# Patient Record
Sex: Male | Born: 1945 | Race: White | Hispanic: No | Marital: Married | Smoking: Former smoker
Health system: Southern US, Community
[De-identification: ages and names within clinical notes are randomized; demographics above are authoritative.]

## PROBLEM LIST (undated history)

## (undated) DIAGNOSIS — K219 Gastro-esophageal reflux disease without esophagitis: Secondary | ICD-10-CM

## (undated) DIAGNOSIS — IMO0001 Reserved for inherently not codable concepts without codable children: Secondary | ICD-10-CM

## (undated) DIAGNOSIS — I10 Essential (primary) hypertension: Secondary | ICD-10-CM

## (undated) DIAGNOSIS — E785 Hyperlipidemia, unspecified: Secondary | ICD-10-CM

## (undated) DIAGNOSIS — J302 Other seasonal allergic rhinitis: Secondary | ICD-10-CM

## (undated) DIAGNOSIS — E669 Obesity, unspecified: Secondary | ICD-10-CM

## (undated) DIAGNOSIS — K449 Diaphragmatic hernia without obstruction or gangrene: Secondary | ICD-10-CM

## (undated) DIAGNOSIS — R42 Dizziness and giddiness: Secondary | ICD-10-CM

## (undated) DIAGNOSIS — Z87898 Personal history of other specified conditions: Secondary | ICD-10-CM

## (undated) DIAGNOSIS — Z8719 Personal history of other diseases of the digestive system: Secondary | ICD-10-CM

## (undated) DIAGNOSIS — Z91018 Allergy to other foods: Secondary | ICD-10-CM

## (undated) HISTORY — PX: COLONOSCOPY W/ BIOPSIES AND POLYPECTOMY: SHX1376

## (undated) HISTORY — PX: EYE SURGERY: SHX253

---

## 2002-11-10 ENCOUNTER — Inpatient Hospital Stay (HOSPITAL_COMMUNITY): Admission: AD | Admit: 2002-11-10 | Discharge: 2002-11-11 | Payer: Self-pay | Admitting: Internal Medicine

## 2002-11-17 ENCOUNTER — Ambulatory Visit (HOSPITAL_COMMUNITY): Admission: RE | Admit: 2002-11-17 | Discharge: 2002-11-17 | Payer: Self-pay | Admitting: Internal Medicine

## 2002-11-17 ENCOUNTER — Encounter (INDEPENDENT_AMBULATORY_CARE_PROVIDER_SITE_OTHER): Payer: Self-pay | Admitting: Internal Medicine

## 2002-12-17 ENCOUNTER — Ambulatory Visit (HOSPITAL_COMMUNITY): Admission: RE | Admit: 2002-12-17 | Discharge: 2002-12-17 | Payer: Self-pay | Admitting: Internal Medicine

## 2005-02-01 ENCOUNTER — Ambulatory Visit: Payer: Self-pay | Admitting: Family Medicine

## 2005-02-22 ENCOUNTER — Ambulatory Visit: Payer: Self-pay | Admitting: Family Medicine

## 2005-09-17 ENCOUNTER — Ambulatory Visit: Payer: Self-pay | Admitting: Family Medicine

## 2005-10-21 ENCOUNTER — Ambulatory Visit: Payer: Self-pay | Admitting: Family Medicine

## 2006-06-30 ENCOUNTER — Ambulatory Visit: Payer: Self-pay | Admitting: Family Medicine

## 2006-10-29 ENCOUNTER — Ambulatory Visit: Payer: Self-pay | Admitting: Family Medicine

## 2008-09-06 ENCOUNTER — Encounter: Admission: RE | Admit: 2008-09-06 | Discharge: 2008-09-06 | Payer: Self-pay | Admitting: Internal Medicine

## 2008-09-13 ENCOUNTER — Encounter: Admission: RE | Admit: 2008-09-13 | Discharge: 2008-09-13 | Payer: Self-pay | Admitting: Internal Medicine

## 2009-12-29 ENCOUNTER — Ambulatory Visit (HOSPITAL_COMMUNITY): Admission: RE | Admit: 2009-12-29 | Discharge: 2009-12-29 | Payer: Self-pay | Admitting: Ophthalmology

## 2010-12-31 LAB — BASIC METABOLIC PANEL
BUN: 13 mg/dL (ref 6–23)
CO2: 26 mEq/L (ref 19–32)
Calcium: 9.2 mg/dL (ref 8.4–10.5)
Chloride: 101 mEq/L (ref 96–112)
Creatinine, Ser: 1.25 mg/dL (ref 0.4–1.5)
GFR calc Af Amer: >60 mL/min (ref >60)
GFR calc non Af Amer: 58 mL/min — ABNORMAL LOW (ref >60)
Glucose, Bld: 102 mg/dL — ABNORMAL HIGH (ref 70–99)
Potassium: 4.1 mEq/L (ref 3.5–5.1)
Sodium: 134 mEq/L — ABNORMAL LOW (ref 135–145)

## 2010-12-31 LAB — CBC
HCT: 41.5 % (ref 39.0–52.0)
Hemoglobin: 14.4 g/dL (ref 13.0–17.0)
MCHC: 34.8 g/dL (ref 30.0–36.0)
MCV: 96.1 fL (ref 78.0–100.0)
Platelets: 181 10*3/uL (ref 150–400)
RBC: 4.32 MIL/uL (ref 4.22–5.81)
RDW: 13 % (ref 11.5–15.5)
WBC: 7.7 10*3/uL (ref 4.0–10.5)

## 2011-02-22 NOTE — Consult Note (Signed)
NAME:  Christopher Mata, Christopher Mata NO.:  [id number]   MEDICAL RECORD NO.:  [id number]                   PATIENT TYPE:   LOCATION:                                       FACILITY:   PHYSICIAN:  Lionel December, M.D.                 DATE OF BIRTH:  08/24/1946   DATE OF CONSULTATION:  DATE OF DISCHARGE:                                   CONSULTATION   PRESENTING COMPLAINT:  Follow-up for anemia and GI bleed.   HISTORY OF PRESENT ILLNESS:  The patient is a 65 year old Caucasian male who  was hospitalized on November 10, 2002 and discharged a day later.  He was  admitted with profound microcytic anemia and history of melena.  Admission  hemoglobin was 7.2 grams.  He was given 2 units of PRBCs and hemoglobin came  up to about 8.7.  He was given a third unit and by the time he was  discharged his hemoglobin was up to 9.7.  He had esophagogastroduodenoscopy  which revealed ulcerative reflux esophagitis, antral gastritis, and a small  gastric antral ulcer.  He also had a large complex hiatal hernia,  most of  which appeared to be sliding.  He was discharged on iron and maintained on  Prilosec.  He returned for upper GI series on November 17, 2002 which  revealed a large complex hernia and there was suggestion of organoaxial  rotation.  He also had reflux gastritis and Dr. Frazier Richards - who did the study  - thought that there was a large gastric ulcer in the body of the stomach.  This was not the case on recent EGD.   The patient states he is feeling a lot better.  His stools are black because  he is taking ferrous sulfate.  He denies heartburn, dysphagia, chest or  abdominal pain.   MEDICATIONS:  1. Prilosec 20 mg b.i.d.  2. Ferrous sulfate 325 mg b.i.d.   PAST MEDICAL HISTORY:  1. GERD of six or seven years duration.  2. History of colonic polyps.  Last colonoscopy was about six or seven years     ago.  3. He had surgery for deflected nasal septum in 1996.   ALLERGIES:  None known.   FAMILY HISTORY:  Positive for colon carcinoma in a mother who died of  metastatic disease in her 72s.   SOCIAL HISTORY:  He has not smoked in 16 years and he drinks alcohol  socially.  He is presently unemployed but has a job interview.  He was  recently laid off from another company.   OBJECTIVE:  VITAL SIGNS:  Weight 208 pounds; he is 5 feet 11 inches tall.  Pulse 86 per minute, blood pressure 130/100.  HEENT:  Conjunctivae are pink, sclerae nonicteric.  NECK:  Without masses, thyromegaly.  HEART AND LUNG:  Exam is within normal limits.  ABDOMEN:  Soft and nontender without organomegaly or  masses.  EXTREMITIES:  He does not have clubbing or coilonychia.   LABORATORY DATA:  CBC from November 25, 2002:  H&H 11.8 and 38.2, MCV 72.1,  wbc 6.3.   ASSESSMENT:  1. 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  He is at risk for incarceration or     frequent bleed.  I really feel that he should have surgery and get it     fixed.  I feel that this could be done laparoscopically.  2. History of colonic polyps and family history of colon carcinoma.  Last     colonoscopy was over five years ago.   PLAN:  Will hold off his iron temporarily and prep him for a colonoscopy two  weeks from now.   Once GI tract evaluation is completed will make an appointment for him to be  seen at tertiary center.  At the present, the patient is not sure he wants  to undergo any surgery but I would encourage him to at least get an opinion.  In the meantime he will continue Prilosec at 20 mg b.i.d.  I have reviewed  the procedure with the patient and he is agreeable.                                               Lionel December, M.D.    NR/MEDQ  D:  11/29/2002  T:  11/29/2002  Job:  [id number]   cc:   Colon Flattery  [address]   [place]  [place] [address]  Fax: [phone]

## 2011-02-22 NOTE — Discharge Summary (Signed)
NAME:  Christopher Mata, Christopher Mata NO.:  [id number]   MEDICAL RECORD NO.:  [id number]                   PATIENT TYPE:  INP   LOCATION:  A301                                 FACILITY:  [facility]   PHYSICIAN:  Lionel December, M.D.                 DATE OF BIRTH:  02/28/1946   DATE OF ADMISSION:  11/10/2002  DATE OF DISCHARGE:  11/11/2002                                 DISCHARGE SUMMARY   DISCHARGE DIAGNOSES:  1. Profound anemia secondary to gastrointestinal bleed.  2. Complex hiatal hernia.  3. Esophageal ulcer.  4. Erosive gastritis with small gastric ulcer.   CONDITION AT THE TIME OF DISCHARGE:  Much improved.   PRINCIPAL PROCEDURE:  EGD November 11, 2002, by Dr. Karilyn Cota.   DISCHARGE MEDICATIONS:  1. Prilosec 20 mg b.i.d. (OTC).  2. Ferrous sulfate 325 mg b.i.d. with meals.   HOSPITAL COURSE:  The patient is a 65 year old Caucasian male who was  hospitalized directly having been seen at Dr. Karmen Stabs office for profound  anemia and heme-positive stools.  The patient had been getting progressively  weaker, which led to the diagnosis of anemia.  He did have a history of  melena.  When he was seen in Dr. Karmen Stabs office stool was guaiac-positive.  He has a history of GERD and had been maintained on Prilosec.  He also had  been on ASA and using Advil fairly frequently for headache.   His laboratories on admission:  His H&H was 7.1 and 23.9, MCV was 60.3, WBC  was 5.8, platelet count was 374,000.  His PT and PTT were normal.  His  electrolytes, BUN, creatinine, hepatic profile were all normal.  He was  begun on IV fluids and given Protonix 40 mg IV q.24h.  He was given two  units of packed red blood cells.  This brought his hemoglobin up to 8.5 and  hematocrit of 27.5.  He underwent esophagogastroduodenoscopy on November 11, 2002, which revealed esophageal ulcer with a large complex hernia, erosive  gastritis with a small gastric ulcer.  He was given another unit of  blood,  and his H&H was up to 9.7 and 25.4.  The patient was discharged in much  improved condition.  The plan is for him to return for upper GI series to  further look at this complex hernia for which I believe he will eventually  require surgery.  Once his hemoglobin is above 10 g, will hold his iron and  bring him back for total colonoscopy.                                               Lionel December, M.D.    NR/MEDQ  D:  11/29/2002  T:  11/29/2002  Job:  [id number]  cc:   Trellis Moment  [address]  [place]  [place] [address]  Fax: [phone]

## 2011-02-22 NOTE — Op Note (Signed)
NAME:  Christopher Mata, ROUTE NO.:  [id number]   MEDICAL RECORD NO.:  [id number]                   PATIENT TYPE:  INP   LOCATION:  A301                                 FACILITY:  [facility]   PHYSICIAN:  Lionel December, M.D.                 DATE OF BIRTH:  07-14-1946   DATE OF PROCEDURE:  11/11/2002  DATE OF DISCHARGE:                                 OPERATIVE REPORT   PROCEDURE:  Esophagogastroduodenoscopy.   INDICATIONS FOR PROCEDURE:  Casmir is a 65 year old Caucasian male with  iron deficiency anemia and heme positive stools. His H. pylori serology is  positive. He has been taking ASA in addition to over the counter Ancef. He  also gives a history of recent melena. His family history is positive for  colon carcinoma and his last colonoscopy was 5+ years ago. He is undergoing  diagnostic EGD. The patient has received 2 units of packed red blood cells  and hemoglobin dropped from 7.1 to 8.5 gm and he feels a lot better. The  procedure is reviewed with the patient and informed consent was obtained.   PREOP MEDICATIONS:  Cetacaine spray for pharyngeal topical anesthesia.  Demerol 25 mg IV, Versed 4 mg IV in divided dose.   INSTRUMENT:  Olympus video system.   FINDINGS:  Procedure performed in endoscopy suite. The patient's vital signs  and O2 saturations were monitored during the procedure. During the  procedure, he did have a transient Wenckebach while I was trying to pass the  scope from a large complex hernia into interim. This resolved without any  intervention. The patient was placed in the left lateral decubitus position,  the endoscope was passed through oropharynx without any difficulty into the  esophagus.   ESOPHAGUS:  The mucosa of the esophagus was normal except distally where he  had a single ulcer about 6 x 8 mm just proximal to the GE junction. There  was a large complex hiatal hernia with dependent segment. It was very hard  to get out of the  hernia in part of the stomach into body and antrum. This  was finally possible by turning the patient in supine position. There were  multiple antral erosions and a small antral ulcer along the lesser  curvature. The pyloric channel was patent. The angularis and fundus were  examined by retroflexing the scope. The hernia could be seen on this view  and was large.   DUODENUM:  Examination of the bulb revealed normal mucosa. The scope was  passed in the second part of the duodenum where the mucosa and folds were  normal. The endoscope was withdrawn. The patient tolerated the procedure  well.   FINAL DIAGNOSES:  1. Ulcerative reflux esophagitis, erosive antral gastritis with a small     antral ulcer.  2. Large complex hiatal hernia. Mostly this appears to be sliding but could  have a paraesophageal component as well.    PLAN:  He will receive another unit of PRBCs today. He will go home on iron  and Prevpac. Will bring him back next week for upper GI series and schedule  him for a colonoscopy at a later date. I feel that he will need antireflux  surgery at some point.                                               Lionel December, M.D.    NR/MEDQ  D:  11/11/2002  T:  11/11/2002  Job:  [id number]   cc:   Colon Flattery  [address]  [place]  [place] [address]  Fax: [phone]

## 2011-02-22 NOTE — Op Note (Signed)
NAME:  Christopher Mata, CRUMBY NO.:  [id number]   MEDICAL RECORD NO.:  [id number]                   PATIENT TYPE:  AMB   LOCATION:  DAY                                  FACILITY:  [facility]   PHYSICIAN:  Lionel December, M.D.                 DATE OF BIRTH:  1945/12/21   DATE OF PROCEDURE:  12/17/2002  DATE OF DISCHARGE:                                 OPERATIVE REPORT   PROCEDURE:  Total colonoscopy.   INDICATIONS FOR PROCEDURE:  The patient is a 65 year old Caucasian male who  was recently hospitalized for profound anemia and GI bleed.  He underwent  EGD.  He was found to have ulcerative reflux esophagitis, gastritis, as well  as gastric ulcer felt to be secondary to NSAID therapy.  He also was found  to have a large complex hiatal hernia.  He has done well with Prilosec and  iron therapy.  His family history is positive for colon carcinoma in the  mother, and he has had polyps taken out six or seven years ago.  He was  advised to come back for colonoscopy.  The procedure was reviewed with the  patient, and informed consent was obtained.   PREOPERATIVE MEDICATIONS:  Demerol 50 mg IV, Versed 5 mg IV in divided  doses.   INSTRUMENT USED:  The Olympus video system.   FINDINGS:  The procedure was performed in the endoscopy suite.  The  patient's vital signs and O2 saturations were monitored during the procedure  and remained stable.  The patient was placed in the left lateral position  and rectal examination performed.  No abnormality noted on external or  digital exam.  The scope was placed in the rectum and advanced to the region  of the sigmoid colon where multiple diverticula were noted.  Some of these  were quite large.  A few more diverticula were noted in the descending  colon.  The preparation was excellent.  The scope was passed to the cecum  which was identified by the appendiceal orifice and ileocecal valve.  A  short segment of __________ was examined  and was also normal.  As the scope  was withdrawn, the colonic mucosa was once again carefully examined.  There  were no polyps or tumor masses, although he had a small linear ulcer above  the dentate line, and the mucosa surrounding it was erythematous.  Pictures  were taken followed by a biopsy.  The endoscope was straightened and  withdrawn.  The patient tolerated the procedure well.   FINAL DIAGNOSES:  1. No evidence of recurrent polyps.  2. Left colonic diverticulosis.  3. Small rectal ulcer.  Biopsies taken.  I am not sure if this is from     trauma due to prep or some other reasons.  Endoscopically, the process     appears benign.    RECOMMENDATIONS:  He will continue  his Prilosec as before.  He will resume  his iron.  He should stay on a high fiber diet and Citrucel.  Anusol HC  suppository, one per rectum at bedtime for two weeks.  I will contact the  patient with the biopsy results next week.  I would like for him to have a  hemoglobin checked in one month from now.  We will call him and remind him.                                               Lionel December, M.D.    NR/MEDQ  D:  12/17/2002  T:  12/17/2002  Job:  [id number]   cc:   Colon Flattery, MD  [address]  [place]  [place] [address]  Fax: [phone]

## 2011-02-22 NOTE — H&P (Signed)
NAME:  Christopher Mata, ALVIZO NO.:  [id number]   MEDICAL RECORD NO.:  [id number]                   PATIENT TYPE:  INP   LOCATION:  A301                                 FACILITY:  [facility]   PHYSICIAN:  Lionel December, M.D.                 DATE OF BIRTH:  Feb 06, 1946   DATE OF ADMISSION:  11/10/2002  DATE OF DISCHARGE:                                HISTORY & PHYSICAL   REASON FOR ADMISSION:  Anemia and melena.   HISTORY OF PRESENT ILLNESS:  The patient is a 65 year old Caucasian male who  has noted progressive weakness over the last two months.  For the last one  week, he has been feeling dizzy and somewhat short of breath.  He went over  to see Dr. Dewaine Conger.  His hemoglobin was noted to be 7.8 g.  He was,  therefore, referred for transfusion and further evaluation.  He does give  history of melena that he had for two days but has now cleared.  His stool  was noted to be heme positive by Dr. Dewaine Conger.  He denies nausea, vomiting,  abdominal pain, diarrhea, or constipation.  He has a good appetite but has  been watching his diet and has lost 9 pounds over the last few weeks.   He has GERD, but heartburn is well controlled with therapy.  He has been  using Advil for headache.  Lately he has taken as many as 9 tablets a day.  He is also on ASA 325 mg daily and Prilosec 20 mg q.a.m.  He does not take  any other over-the-counter medications.   REVIEW OF SYSTEMS:  Negative for chest pain, PND, orthopnea, fever, chills,  or night sweats.   PAST MEDICAL HISTORY:  1. GERD for 6 or 7 years.  He has been on various medications, and presently     symptoms are well controlled with Prilosec.  His last EGD was about 7     years ago.  He also had a colonoscopy at the same time primarily for high-     risk screening.  2. He had surgery for a deflected nasal septum in 1996.   ALLERGIES:  No known drug allergies.   FAMILY HISTORY:  Father is 41 years old and doing fairly well.   Mother died  of metastatic colon carcinoma in her 70s.  He has one sister who has had  part of liver removed, but he does not know the details.   SOCIAL HISTORY:  He is married, and he has three children who are in good  health.  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  He smoked cigarettes for 24 years but quit when he was 40.  He  drinks two to three drinks of alcohol every night.   PHYSICAL EXAMINATION:  GENERAL:  Pleasant, well developed, well  nourished  Caucasian male who is in no acute distress.  VITAL SIGNS:  He weighs 201 pounds.  He is 5 feet 11 inches tall.  Pulse 95  per minute, blood pressure 146/108, temperature 97.6, respirations 20.  HEENT:  Conjunctivae pale.  Sclerae nonicteric.  Oropharyngeal mucosa is  normal.  Dentition in satisfactory condition.  NECK:  Without masses or thyromegaly.  Carotids are 2+ bilaterally and  without bruits.  CARDIAC:  Regular rhythm, normal S1 and S2.  No murmur or gallop noted.  LUNGS:  Clear to auscultation.  ABDOMEN:  Symmetrical.  Bowel sounds are normal.  Soft abdomen without  tenderness, organomegaly, or masses.  RECTAL:  Examination deferred.  EXTREMITIES:  He does not have clubbing, coilonychia, or peripheral edema.   LABORATORY DATA:  Labs from 11/05/2002 done at Dr. Karmen Stabs office showed WBC  4.7, hemoglobin 7.8, hematocrit 27.6, MCV 63.3, platelet count 398,000.  H.  pylori serology positive.  TSH was 1.66.  PSA 0.4.  Cholesterol 188, HDL 62,  LDL 113.  Comprehensive chemistry panel was normal other than glucose of  120.   ASSESSMENT:  The patient is a 65 year old Caucasian male who presents with  progressive weakness, lightheadedness, and was found to have microcytic  anemia and hemoccult-positive stools.  He has been using over-the-counter  nonsteroidal anti-inflammatories for headache, and he is also on ASA.  He  has chronic gastroesophageal reflux disease, but his symptoms are well   controlled with proton pump inhibitor.  I suspect we are dealing with peptic  ulcer disease that is chronic with intermittent bleeding.  It appears to be  that he recently had a worse bleed in the form of melena which may have  tipped him over.  Family history is positive for colon carcinoma.  It is  unclear to me how long ago was his last colonoscopy.  If it has been more  than five years, this will need to be done at some point.   PLAN:  We will repeat his blood work and type and cross match for 2 units of  packed red blood cells.  Will also check a PT/PTT.  Will start him on IV  fluids and Protonix 40 mg IV daily.  He will receive 2 units of packed red  blood cells if hemoglobin is less than 8 g.  Will plan EGD in the a.m.  I  have reviewed the procedure with the patient, and he is agreeable.  If EGD  is entirely normal, he will need colonoscopy during this hospitalization,  otherwise would be delayed.                                                Lionel December, M.D.    NR/MEDQ  D:  11/10/2002  T:  11/10/2002  Job:  [id number]   cc:   Colon Flattery  [address]  [place]  [place] [address]  Fax: [phone]

## 2014-12-20 ENCOUNTER — Ambulatory Visit
Admission: RE | Admit: 2014-12-20 | Discharge: 2014-12-20 | Disposition: A | Discharge status: Home / Self Care | Payer: Medicare Other | Source: Ambulatory Visit | Attending: Nurse Practitioner | Admitting: Nurse Practitioner

## 2014-12-20 ENCOUNTER — Other Ambulatory Visit: Payer: Self-pay | Admitting: Nurse Practitioner

## 2014-12-20 DIAGNOSIS — E299 Testicular dysfunction, unspecified: Secondary | ICD-10-CM | POA: Diagnosis not present

## 2014-12-20 DIAGNOSIS — E780 Pure hypercholesterolemia: Secondary | ICD-10-CM | POA: Diagnosis not present

## 2014-12-20 DIAGNOSIS — R9431 Abnormal electrocardiogram [ECG] [EKG]: Secondary | ICD-10-CM | POA: Diagnosis not present

## 2014-12-20 DIAGNOSIS — R059 Cough, unspecified: Secondary | ICD-10-CM

## 2014-12-20 DIAGNOSIS — R0602 Shortness of breath: Secondary | ICD-10-CM | POA: Diagnosis not present

## 2014-12-20 DIAGNOSIS — I10 Essential (primary) hypertension: Secondary | ICD-10-CM | POA: Diagnosis not present

## 2014-12-20 DIAGNOSIS — R05 Cough: Secondary | ICD-10-CM

## 2014-12-20 DIAGNOSIS — I450 Right fascicular block: Secondary | ICD-10-CM | POA: Diagnosis not present

## 2014-12-20 DIAGNOSIS — E559 Vitamin D deficiency, unspecified: Secondary | ICD-10-CM | POA: Diagnosis not present

## 2014-12-20 DIAGNOSIS — R5383 Other fatigue: Secondary | ICD-10-CM | POA: Diagnosis not present

## 2014-12-20 DIAGNOSIS — Z9109 Other allergy status, other than to drugs and biological substances: Secondary | ICD-10-CM | POA: Diagnosis not present

## 2014-12-21 DIAGNOSIS — I10 Essential (primary) hypertension: Secondary | ICD-10-CM | POA: Diagnosis not present

## 2014-12-21 DIAGNOSIS — R0602 Shortness of breath: Secondary | ICD-10-CM | POA: Diagnosis not present

## 2014-12-21 DIAGNOSIS — R9431 Abnormal electrocardiogram [ECG] [EKG]: Secondary | ICD-10-CM | POA: Diagnosis not present

## 2014-12-21 DIAGNOSIS — E780 Pure hypercholesterolemia: Secondary | ICD-10-CM | POA: Diagnosis not present

## 2014-12-27 DIAGNOSIS — R0602 Shortness of breath: Secondary | ICD-10-CM | POA: Diagnosis not present

## 2014-12-27 DIAGNOSIS — R9431 Abnormal electrocardiogram [ECG] [EKG]: Secondary | ICD-10-CM | POA: Diagnosis not present

## 2014-12-28 DIAGNOSIS — R0602 Shortness of breath: Secondary | ICD-10-CM | POA: Diagnosis not present

## 2014-12-28 DIAGNOSIS — R9431 Abnormal electrocardiogram [ECG] [EKG]: Secondary | ICD-10-CM | POA: Diagnosis not present

## 2015-01-02 DIAGNOSIS — R0602 Shortness of breath: Secondary | ICD-10-CM | POA: Diagnosis not present

## 2015-01-02 DIAGNOSIS — R0789 Other chest pain: Secondary | ICD-10-CM | POA: Diagnosis not present

## 2015-01-04 ENCOUNTER — Other Ambulatory Visit: Payer: Self-pay | Admitting: Gastroenterology

## 2015-01-04 DIAGNOSIS — K449 Diaphragmatic hernia without obstruction or gangrene: Secondary | ICD-10-CM

## 2015-01-04 DIAGNOSIS — R0602 Shortness of breath: Secondary | ICD-10-CM | POA: Diagnosis not present

## 2015-01-04 DIAGNOSIS — R933 Abnormal findings on diagnostic imaging of other parts of digestive tract: Secondary | ICD-10-CM | POA: Diagnosis not present

## 2015-01-09 ENCOUNTER — Ambulatory Visit (HOSPITAL_COMMUNITY)
Admission: RE | Admit: 2015-01-09 | Discharge: 2015-01-09 | Disposition: A | Discharge status: Home / Self Care | Payer: Medicare Other | Source: Ambulatory Visit | Attending: Gastroenterology | Admitting: Gastroenterology

## 2015-01-09 DIAGNOSIS — K219 Gastro-esophageal reflux disease without esophagitis: Secondary | ICD-10-CM | POA: Diagnosis not present

## 2015-01-09 DIAGNOSIS — K225 Diverticulum of esophagus, acquired: Secondary | ICD-10-CM | POA: Insufficient documentation

## 2015-01-09 DIAGNOSIS — K449 Diaphragmatic hernia without obstruction or gangrene: Secondary | ICD-10-CM | POA: Insufficient documentation

## 2015-01-12 ENCOUNTER — Encounter (HOSPITAL_COMMUNITY): Payer: Self-pay | Admitting: *Deleted

## 2015-01-12 NOTE — Progress Notes (Signed)
   01/12/15 1025  OBSTRUCTIVE SLEEP APNEA  Have you ever been diagnosed with sleep apnea through a sleep study? No  Do you snore loudly (loud enough to be heard through closed doors)?  1  Do you often feel tired, fatigued, or sleepy during the daytime? 0  Has anyone observed you stop breathing during your sleep? 0  Do you have, or are you being treated for high blood pressure? 1  Age over 69 years old? 1  Gender: 1

## 2015-01-12 NOTE — Progress Notes (Signed)
Pt denies chest pain but is under the care af Dr. Jacinto HalimGanji, cardiology. Pt verbalized understanding of all pre-op instructions.

## 2015-01-13 ENCOUNTER — Encounter (HOSPITAL_COMMUNITY): Payer: Self-pay | Admitting: Critical Care Medicine

## 2015-01-13 ENCOUNTER — Ambulatory Visit (HOSPITAL_COMMUNITY): Payer: Medicare Other | Admitting: Critical Care Medicine

## 2015-01-13 ENCOUNTER — Ambulatory Visit (HOSPITAL_COMMUNITY)
Admission: RE | Admit: 2015-01-13 | Discharge: 2015-01-13 | Disposition: A | Discharge status: Home / Self Care | Payer: Medicare Other | Source: Ambulatory Visit | Attending: Gastroenterology | Admitting: Gastroenterology

## 2015-01-13 ENCOUNTER — Encounter (HOSPITAL_COMMUNITY)
Admission: RE | Disposition: A | Discharge status: Home / Self Care | Payer: Self-pay | Source: Ambulatory Visit | Attending: Gastroenterology

## 2015-01-13 DIAGNOSIS — I10 Essential (primary) hypertension: Secondary | ICD-10-CM | POA: Insufficient documentation

## 2015-01-13 DIAGNOSIS — K219 Gastro-esophageal reflux disease without esophagitis: Secondary | ICD-10-CM | POA: Diagnosis not present

## 2015-01-13 DIAGNOSIS — K449 Diaphragmatic hernia without obstruction or gangrene: Secondary | ICD-10-CM | POA: Insufficient documentation

## 2015-01-13 DIAGNOSIS — R0602 Shortness of breath: Secondary | ICD-10-CM | POA: Insufficient documentation

## 2015-01-13 DIAGNOSIS — R933 Abnormal findings on diagnostic imaging of other parts of digestive tract: Secondary | ICD-10-CM | POA: Diagnosis not present

## 2015-01-13 HISTORY — DX: Dizziness and giddiness: R42

## 2015-01-13 HISTORY — DX: Gastro-esophageal reflux disease without esophagitis: K21.9

## 2015-01-13 HISTORY — DX: Reserved for inherently not codable concepts without codable children: IMO0001

## 2015-01-13 HISTORY — DX: Other seasonal allergic rhinitis: J30.2

## 2015-01-13 HISTORY — DX: Essential (primary) hypertension: I10

## 2015-01-13 HISTORY — DX: Personal history of other specified conditions: Z87.898

## 2015-01-13 HISTORY — PX: ESOPHAGOGASTRODUODENOSCOPY: SHX5428

## 2015-01-13 HISTORY — DX: Hyperlipidemia, unspecified: E78.5

## 2015-01-13 HISTORY — DX: Personal history of other diseases of the digestive system: Z87.19

## 2015-01-13 SURGERY — EGD (ESOPHAGOGASTRODUODENOSCOPY)
Anesthesia: Monitor Anesthesia Care

## 2015-01-13 MED ORDER — PROPOFOL INFUSION 10 MG/ML OPTIME
INTRAVENOUS | Status: DC | PRN
  Administered 2015-01-13: 200 ug/kg/min via INTRAVENOUS

## 2015-01-13 MED ORDER — LACTATED RINGERS IV SOLN
INTRAVENOUS | Status: DC
  Administered 2015-01-13: 1000 mL via INTRAVENOUS

## 2015-01-13 MED ORDER — BUTAMBEN-TETRACAINE-BENZOCAINE 2-2-14 % EX AERO
INHALATION_SPRAY | CUTANEOUS | Status: DC | PRN
  Administered 2015-01-13: 2 via TOPICAL

## 2015-01-13 MED ORDER — SODIUM CHLORIDE 0.9 % IV SOLN: INTRAVENOUS | Status: DC

## 2015-01-13 NOTE — Anesthesia Preprocedure Evaluation (Addendum)
Anesthesia Evaluation  Patient identified by MRN, date of birth, ID band Patient awake    Reviewed: Allergy & Precautions, NPO status , Patient's Chart, lab work & pertinent test results  History of Anesthesia Complications Negative for: history of anesthetic complications  Airway Mallampati: II  TM Distance: >3 FB Neck ROM: Full    Dental  (+) Dental Advisory Given   Pulmonary shortness of breath,  breath sounds clear to auscultation        Cardiovascular hypertension, Pt. on medications Rhythm:Regular     Neuro/Psych negative neurological ROS  negative psych ROS   GI/Hepatic Neg liver ROS, hiatal hernia, GERD-  Medicated,  Endo/Other  Morbid obesity  Renal/GU negative Renal ROS     Musculoskeletal   Abdominal   Peds  Hematology negative hematology ROS (+)   Anesthesia Other Findings   Reproductive/Obstetrics                            Anesthesia Physical Anesthesia Plan  ASA: II  Anesthesia Plan: MAC   Post-op Pain Management:    Induction: Intravenous  Airway Management Planned: Nasal Cannula  Additional Equipment: None  Intra-op Plan:   Post-operative Plan:   Informed Consent: I have reviewed the patients History and Physical, chart, labs and discussed the procedure including the risks, benefits and alternatives for the proposed anesthesia with the patient or authorized representative who has indicated his/her understanding and acceptance.   Dental advisory given  Plan Discussed with: Surgeon and CRNA  Anesthesia Plan Comments:        Anesthesia Quick Evaluation

## 2015-01-13 NOTE — Discharge Instructions (Signed)
Esophagogastroduodenoscopy °Care After °Refer to this sheet in the next few weeks. These instructions provide you with information on caring for yourself after your procedure. Your caregiver may also give you more specific instructions. Your treatment has been planned according to current medical practices, but problems sometimes occur. Call your caregiver if you have any problems or questions after your procedure.  °HOME CARE INSTRUCTIONS °· Do not eat or drink anything until the numbing medicine (local anesthetic) has worn off and your gag reflex has returned. You will know that the local anesthetic has worn off when you can swallow comfortably. °· Do not drive for 12 hours after the procedure or as directed by your caregiver. °· Only take medicines as directed by your caregiver. °SEEK MEDICAL CARE IF:  °· You cannot stop coughing. °· You are not urinating at all or less than usual. °SEEK IMMEDIATE MEDICAL CARE IF: °· You have difficulty swallowing. °· You cannot eat or drink. °· You have worsening throat or chest pain. °· You have dizziness, lightheadedness, or you faint. °· You have nausea or vomiting. °· You have chills. °· You have a fever. °· You have severe abdominal pain. °· You have black, tarry, or bloody stools. °Document Released: 09/09/2012 Document Reviewed: 09/09/2012 °ExitCare® Patient Information ©2015 [employer]. This information is not intended to replace advice given to you by your health care provider. Make sure you discuss any questions you have with your health care provider. ° ° °Conscious Sedation, Adult, Care After °Refer to this sheet in the next few weeks. These instructions provide you with information on caring for yourself after your procedure. Your health care provider may also give you more specific instructions. Your treatment has been planned according to current medical practices, but problems sometimes occur. Call your health care provider if you have any problems or  questions after your procedure. °WHAT TO EXPECT AFTER THE PROCEDURE  °After your procedure: °· You may feel sleepy, clumsy, and have poor balance for several hours. °· Vomiting may occur if you eat too soon after the procedure. °HOME CARE INSTRUCTIONS °· Do not participate in any activities where you could become injured for at least 24 hours. Do not: °¨ Drive. °¨ Swim. °¨ Ride a bicycle. °¨ Operate heavy machinery. °¨ Cook. °¨ Use power tools. °¨ Climb ladders. °¨ Work from a high place. °· Do not make important decisions or sign legal documents until you are improved. °· If you vomit, drink water, juice, or soup when you can drink without vomiting. Make sure you have little or no nausea before eating solid foods. °· Only take over-the-counter or prescription medicines for pain, discomfort, or fever as directed by your health care provider. °· Make sure you and your family fully understand everything about the medicines given to you, including what side effects may occur. °· You should not drink alcohol, take sleeping pills, or take medicines that cause drowsiness for at least 24 hours. °· If you smoke, do not smoke without supervision. °· If you are feeling better, you may resume normal activities 24 hours after you were sedated. °· Keep all appointments with your health care provider. °SEEK MEDICAL CARE IF: °· Your skin is pale or bluish in color. °· You continue to feel nauseous or vomit. °· Your pain is getting worse and is not helped by medicine. °· You have bleeding or swelling. °· You are still sleepy or feeling clumsy after 24 hours. °SEEK IMMEDIATE MEDICAL CARE IF: °· You develop a   rash. °· You have difficulty breathing. °· You develop any type of allergic problem. °· You have a fever. °MAKE SURE YOU: °· Understand these instructions. °· Will watch your condition. °· Will get help right away if you are not doing well or get worse. °Document Released: 07/14/2013 Document Reviewed: 07/14/2013 °ExitCare®  Patient Information ©2015 [employer]. This information is not intended to replace advice given to you by your health care provider. Make sure you discuss any questions you have with your health care provider. ° °

## 2015-01-13 NOTE — Interval H&P Note (Signed)
History and Physical Interval Note:  01/13/2015 7:37 AM  Christopher Mata  has presented today for surgery, with the diagnosis of diaphragmatic hernia/SOB  The various methods of treatment have been discussed with the patient and family. After consideration of risks, benefits and other options for treatment, the patient has consented to  Procedure(s): ESOPHAGOGASTRODUODENOSCOPY (EGD) (N/A) as a surgical intervention .  The patient's history has been reviewed, patient examined, no change in status, stable for surgery.  I have reviewed the patient's chart and labs.  Questions were answered to the patient's satisfaction.    He has an organoaxial hiatal hernia.  He is here for an EGD to ensure that no other abnormalities are uncovered.  [name] D

## 2015-01-13 NOTE — H&P (View-Only) (Signed)
Pt denies chest pain but is under the care af Dr. Jacinto HalimGanji, cardiology. Pt verbalized understanding of all pre-op instructions.

## 2015-01-13 NOTE — Transfer of Care (Signed)
Immediate Anesthesia Transfer of Care Note  Patient: Christopher StackWilliam Eddie Mata  Procedure(s) Performed: Procedure(s): ESOPHAGOGASTRODUODENOSCOPY (EGD) (N/A)  Patient Location: Endoscopy Unit  Anesthesia Type:MAC  Level of Consciousness: sedated  Airway & Oxygen Therapy: Patient Spontanous Breathing and Patient connected to nasal cannula oxygen  Post-op Assessment: Report given to RN and Post -op Vital signs reviewed and stable  Post vital signs: Reviewed and stable  Last Vitals:  Filed Vitals:   01/13/15 0847  BP: 118/76  Pulse: 81  Temp: 36.6 C  Resp: 17    Complications: No apparent anesthesia complications

## 2015-01-13 NOTE — Op Note (Signed)
[facility] [address] [place], [id number]   ENDOSCOPY PROCEDURE REPORT  PATIENT: Christopher Mata, Christopher Mata  MR#: [id number] BIRTHDATE: 1945/12/22 , 68  yrs. old GENDER: male ENDOSCOPIST:Frimy Uffelman Elnoria HowardHung, MD REFERRED BY: PROCEDURE DATE:  01/13/2015 PROCEDURE:   EGD, diagnostic ASA CLASS:    Class II INDICATIONS: SOB secondary to a large organoaxial hiatal hernia  MEDICATION: Monitored anesthesia care TOPICAL ANESTHETIC:   none  DESCRIPTION OF PROCEDURE:   After the risks and benefits of the procedure were explained, informed consent was obtained.  The Pentax Gastroscope [id number]  endoscope was introduced through the mouth  and advanced to the second portion of the duodenum .  The instrument was slowly withdrawn as the mucosa was fully examined. Estimated blood loss is zero unless otherwise noted in this procedure report.    FINDINGS: Technical issues precluded the ability to capture images. the Z-line was sharp.  Grossly there was the appearance of a paraesophageal hiatal hernia.  The endoscope had some difficulty traversing this region and into the gastric body.  However, once in the gastric body and the scope loop was reduced the endoscope was able to traverse the small bowel with ease.  No other abnormalities noted.          The scope was then withdrawn from the patient and the procedure completed.  COMPLICATIONS: There were no immediate complications.  ENDOSCOPIC IMPRESSION: 1) Large paraesophageal hiatal hernia - grossly.  (UGI series reveals an organoaxial hiatal hernia).  RECOMMENDATIONS: 1) Surgical intervention.   _______________________________ eSignedJeani Hawking:  Graylon Amory, MD 01/13/2015 9:56 AM     cc:  CPT CODES: ICD CODES:  The ICD and CPT codes recommended by this software are interpretations from the data that the clinical staff has captured with the software.  The verification of the translation of this report to the ICD and CPT  codes and modifiers is the sole responsibility of the health care institution and practicing physician where this report was generated.  [employer] will not be held responsible for the validity of the ICD and CPT codes included on this report.  AMA assumes no liability for data contained or not contained herein. CPT is a Publishing rights managerregistered trademark of the [employer].

## 2015-01-16 ENCOUNTER — Encounter (HOSPITAL_COMMUNITY): Payer: Self-pay | Admitting: Gastroenterology

## 2015-01-16 NOTE — Anesthesia Postprocedure Evaluation (Signed)
  Anesthesia Post-op Note  Patient: Katrina StackWilliam Eddie Firmin  Procedure(s) Performed: Procedure(s): ESOPHAGOGASTRODUODENOSCOPY (EGD) (N/A)  Patient Location: Endoscopy Unit  Anesthesia Type:MAC  Level of Consciousness: awake  Airway and Oxygen Therapy: Patient Spontanous Breathing  Post-op Pain: none  Post-op Assessment: Post-op Vital signs reviewed, Patient's Cardiovascular Status Stable, Respiratory Function Stable, Patent Airway, No signs of Nausea or vomiting and Pain level controlled  Post-op Vital Signs: Reviewed and stable  Last Vitals:  Filed Vitals:   01/13/15 1019  BP: 113/82  Pulse: 76  Temp:   Resp: 13    Complications: No apparent anesthesia complications

## 2015-02-13 DIAGNOSIS — K219 Gastro-esophageal reflux disease without esophagitis: Secondary | ICD-10-CM | POA: Diagnosis not present

## 2015-02-13 DIAGNOSIS — K449 Diaphragmatic hernia without obstruction or gangrene: Secondary | ICD-10-CM | POA: Diagnosis not present

## 2015-03-08 NOTE — Progress Notes (Signed)
Please put orders in Epic surgery 03-17-15 pre op 03-14-15 Thanks

## 2015-03-09 ENCOUNTER — Ambulatory Visit: Payer: Self-pay | Admitting: General Surgery

## 2015-03-09 NOTE — H&P (Signed)
History of Present Illness Christopher Filler MD; 02/13/2015 9:22 AM) Patient words: hernia.  The patient is a 69 year old male who presents for an evaluation of a hernia. Patient is a 69 year old male who is referred by Christopher Mata for evaluation of a hiatal hernia. The patient states he is had a long history of known he's had a hiatal hernia. He states become more symptomatic recently. He recently saw Christopher Mata for shortness of breath. Upon his workup he noticed that he did have a large hiatal hernia which was likely contributing to his shortness of breath. Patient underwent further evaluation by Christopher Mata which included an upper GI study as well as a upper endoscopy. His upper GI study confirmed a large left-sided hiatal hernia containing a large majority of the stomach. The patient states that he notices symptoms when walking uphill feel short of breath. The patient is currently retired. He does not do any heavy lifting. He does not smoke.   Other Problems Christopher Mata, CMA; 02/13/2015 8:54 AM) Enlarged Prostate Gastric Ulcer Gastroesophageal Reflux Disease Hemorrhoids Hypercholesterolemia Inguinal Hernia  Past Surgical History Christopher Mata, CMA; 02/13/2015 8:54 AM) Colon Polyp Removal - Colonoscopy Colon Polyp Removal - Open  Diagnostic Studies History Christopher Mata, CMA; 02/13/2015 8:54 AM) Colonoscopy 1-5 years ago  Allergies Christopher Mata, CMA; 02/13/2015 8:55 AM) No Known Drug Allergies05/06/2015  Medication History (Christopher Mata, CMA; 02/13/2015 8:55 AM) Atorvastatin Calcium (20MG  Tablet, Oral) Active. Finasteride (5MG  Tablet, Oral) Active. Fluticasone Propionate (50MCG/ACT Suspension, Nasal) Active. Metoprolol Tartrate (25MG  Tablet, Oral) Active. Omeprazole (20MG  Capsule DR, Oral) Active. Valsartan-Hydrochlorothiazide (80-12.5MG  Tablet, Oral) Active. Medications Reconciled  Social History Christopher Mata, CMA; 02/13/2015 8:54 AM) Alcohol use Moderate alcohol  use. Caffeine use Coffee. No drug use Tobacco use Former smoker.  Family History Christopher Mata, CMA; 02/13/2015 8:54 AM) Alcohol Abuse Father. Arthritis Mother. Cerebrovascular Accident Father, Mother. Colon Cancer Mother. Heart Disease Mother.  Review of Systems Christopher Mata CMA; 02/13/2015 8:54 AM) General Not Present- Appetite Loss, Chills, Fatigue, Fever, Night Sweats, Weight Gain and Weight Loss. Skin Not Present- Change in Wart/Mole, Dryness, Hives, Jaundice, New Lesions, Non-Healing Wounds, Rash and Ulcer. HEENT Present- Wears glasses/contact lenses. Not Present- Earache, Hearing Loss, Hoarseness, Nose Bleed, Oral Ulcers, Ringing in the Ears, Seasonal Allergies, Sinus Pain, Sore Throat, Visual Disturbances and Yellow Eyes. Respiratory Present- Chronic Cough, Difficulty Breathing, Snoring and Wheezing. Not Present- Bloody sputum. Breast Not Present- Breast Mass, Breast Pain, Nipple Discharge and Skin Changes. Cardiovascular Present- Shortness of Breath. Not Present- Chest Pain, Difficulty Breathing Lying Down, Leg Cramps, Palpitations, Rapid Heart Rate and Swelling of Extremities. Gastrointestinal Not Present- Abdominal Pain, Bloating, Bloody Stool, Change in Bowel Habits, Chronic diarrhea, Constipation, Difficulty Swallowing, Excessive gas, Gets full quickly at meals, Hemorrhoids, Indigestion, Nausea, Rectal Pain and Vomiting. Male Genitourinary Present- Frequency and Nocturia. Not Present- Blood in Urine, Change in Urinary Stream, Impotence, Painful Urination, Urgency and Urine Leakage. Musculoskeletal Not Present- Back Pain, Joint Pain, Joint Stiffness, Muscle Pain, Muscle Weakness and Swelling of Extremities. Neurological Not Present- Decreased Memory, Fainting, Headaches, Numbness, Seizures, Tingling, Tremor, Trouble walking and Weakness. Psychiatric Not Present- Anxiety, Bipolar, Change in Sleep Pattern, Depression, Fearful and Frequent crying. Endocrine Not Present- Cold  Intolerance, Excessive Hunger, Hair Changes, Heat Intolerance, Hot flashes and New Diabetes.   Vitals (Christopher Mata CMA; 02/13/2015 8:55 AM) 02/13/2015 8:55 AM Weight: 229 lb Height: 71in Body Surface Area: 2.28 m Body Mass Index: 31.94 kg/m Temp.: 97.50F(Temporal)  Pulse: 78 (Regular)  BP: 128/80 (Sitting, Left  Arm, Standard)    Physical Exam Christopher Mata(Christopher Sloop MD; 02/13/2015 9:22 AM) General Mental Status-Alert. General Appearance-Consistent with stated age. Hydration-Well hydrated. Voice-Normal.  Head and Neck Head-normocephalic, atraumatic with no lesions or palpable masses. Trachea-midline. Thyroid Gland Characteristics - normal size and consistency.  Chest and Lung Exam Chest and lung exam reveals -quiet, even and easy respiratory effort with no use of accessory muscles and on auscultation, normal breath sounds, no adventitious sounds and normal vocal resonance. Inspection Chest Wall - Normal. Back - normal.  Cardiovascular Cardiovascular examination reveals -normal heart sounds, regular rate and rhythm with no murmurs and normal pedal pulses bilaterally.    Assessment & Plan Christopher Mata(Christopher Astacio MD; 02/13/2015 9:23 AM) HIATAL HERNIA WITH GERD (530.81  K21.9) Impression: 69 year old male with a large hiatal hernia.  1. The patient will like to proceed to the operating room for a hiatal hernia repair with Nissen fundoplication. 2. Discussed with the patient the risks and benefits of the procedure to include but not limited to: Infection, bleeding, damage to surrounding structures specifically the esophagus and long, Possible need for further surgery and/or chest tube placement, and possible recurrence. Patient voiced understanding and wishes to proceed.

## 2015-03-13 NOTE — Patient Instructions (Addendum)
Christopher StackWilliam Eddie Drolet  03/13/2015   Your procedure is scheduled on: 03/17/2015    [facility] and follow signs to               Short Stay Center at    0515 AM.  Call this number if you have problems the morning of surgery [phone]   Remember: ONLY 1 PERSON MAY GO WITH YOU TO SHORT STAY TO GET  READY MORNING OF YOUR SURGERY.  Do not eat food or drink liquids :After Midnight.     Take these medicines the morning of surgery with A SIP OF WATER:    Amlodipine ( Norvasc), Zyrtec, Proscar, Flonase, Prilosec                                You may not have any metal on your body including hair pins and              piercings  Do not wear jewelry,  lotions, powders or perfumes, deodorant                         Men may shave face and neck.   Do not bring valuables to the hospital. [facility] IS NOT             RESPONSIBLE   FOR VALUABLES.  Contacts, dentures or bridgework may not be worn into surgery.  Leave suitcase in the car. After surgery it may be brought to your room.       Special Instructions: coughing and deep breathing exercises, leg exercises               Please read over the following fact sheets you were given: _____________________________________________________________________             [facility] - Preparing for [facility] Before surgery, you can play an important role.  Because skin is not sterile, your skin needs to be as free of germs as possible.  You can reduce the number of germs on your skin by washing with CHG (chlorahexidine gluconate) soap before surgery.  CHG is an antiseptic cleaner which kills germs and bonds with the skin to continue killing germs even after washing. Please DO NOT use if you have an allergy to CHG or antibacterial soaps.  If your skin becomes reddened/irritated stop using the CHG and inform your nurse when you arrive at Short Stay. Do not shave (including legs and underarms) for at least 48  hours prior to the first CHG shower.  You may shave your face/neck. Please follow these instructions carefully:  1.  Shower with CHG Soap the night before surgery and the  morning of Surgery.  2.  If you choose to wash your hair, wash your hair first as usual with your  normal  shampoo.  3.  After you shampoo, rinse your hair and body thoroughly to remove the  shampoo.                           4.  Use CHG as you would any other liquid soap.  You can apply chg directly  to the skin and wash  Gently with a scrungie or clean washcloth.  5.  Apply the CHG Soap to your body ONLY FROM THE NECK DOWN.   Do not use on face/ open                           Wound or open sores. Avoid contact with eyes, ears mouth and genitals (private parts).                       Wash face,  Genitals (private parts) with your normal soap.             6.  Wash thoroughly, paying special attention to the area where your surgery  will be performed.  7.  Thoroughly rinse your body with warm water from the neck down.  8.  DO NOT shower/wash with your normal soap after using and rinsing off  the CHG Soap.                9.  Pat yourself dry with a clean towel.            10.  Wear clean pajamas.            11.  Place clean sheets on your bed the night of your first shower and do not  sleep with pets. Day of Surgery : Do not apply any lotions/deodorants the morning of surgery.  Please wear clean clothes to the hospital/surgery center.  FAILURE TO FOLLOW THESE INSTRUCTIONS MAY RESULT IN THE CANCELLATION OF YOUR SURGERY PATIENT SIGNATURE_________________________________  NURSE SIGNATURE__________________________________  ________________________________________________________________________  WHAT IS A BLOOD TRANSFUSION? Blood Transfusion Information  A transfusion is the replacement of blood or some of its parts. Blood is made up of multiple cells which provide different functions.  Red blood cells  carry oxygen and are used for blood loss replacement.  White blood cells fight against infection.  Platelets control bleeding.  Plasma helps clot blood.  Other blood products are available for specialized needs, such as hemophilia or other clotting disorders. BEFORE THE TRANSFUSION  Who gives blood for transfusions?   Healthy volunteers who are fully evaluated to make sure their blood is safe. This is blood bank blood. Transfusion therapy is the safest it has ever been in the practice of medicine. Before blood is taken from a donor, a complete history is taken to make sure that person has no history of diseases nor engages in risky social behavior (examples are intravenous drug use or sexual activity with multiple partners). The donor's travel history is screened to minimize risk of transmitting infections, such as malaria. The donated blood is tested for signs of infectious diseases, such as HIV and hepatitis. The blood is then tested to be sure it is compatible with you in order to minimize the chance of a transfusion reaction. If you or a relative donates blood, this is often done in anticipation of surgery and is not appropriate for emergency situations. It takes many days to process the donated blood. RISKS AND COMPLICATIONS Although transfusion therapy is very safe and saves many lives, the main dangers of transfusion include:   Getting an infectious disease.  Developing a transfusion reaction. This is an allergic reaction to something in the blood you were given. Every precaution is taken to prevent this. The decision to have a blood transfusion has been considered carefully by your caregiver before blood is given. Blood is not given unless the benefits outweigh  the risks. AFTER THE TRANSFUSION  Right after receiving a blood transfusion, you will usually feel much better and more energetic. This is especially true if your red blood cells have gotten low (anemic). The transfusion raises  the level of the red blood cells which carry oxygen, and this usually causes an energy increase.  The nurse administering the transfusion will monitor you carefully for complications. HOME CARE INSTRUCTIONS  No special instructions are needed after a transfusion. You may find your energy is better. Speak with your caregiver about any limitations on activity for underlying diseases you may have. SEEK MEDICAL CARE IF:   Your condition is not improving after your transfusion.  You develop redness or irritation at the intravenous (IV) site. SEEK IMMEDIATE MEDICAL CARE IF:  Any of the following symptoms occur over the next 12 hours:  Shaking chills.  You have a temperature by mouth above 102 F (38.9 C), not controlled by medicine.  Chest, back, or muscle pain.  People around you feel you are not acting correctly or are confused.  Shortness of breath or difficulty breathing.  Dizziness and fainting.  You get a rash or develop hives.  You have a decrease in urine output.  Your urine turns a dark color or changes to pink, red, or brown. Any of the following symptoms occur over the next 10 days:  You have a temperature by mouth above 102 F (38.9 C), not controlled by medicine.  Shortness of breath.  Weakness after normal activity.  The white part of the eye turns yellow (jaundice).  You have a decrease in the amount of urine or are urinating less often.  Your urine turns a dark color or changes to pink, red, or brown. Document Released: 09/20/2000 Document Revised: 12/16/2011 Document Reviewed: 05/09/2008 [facility] Patient Information 2014 [place], [place].  _______________________________________________________________________

## 2015-03-14 ENCOUNTER — Encounter (HOSPITAL_COMMUNITY): Payer: Self-pay

## 2015-03-14 ENCOUNTER — Encounter (HOSPITAL_COMMUNITY)
Admission: RE | Admit: 2015-03-14 | Discharge: 2015-03-14 | Disposition: A | Discharge status: Home / Self Care | Payer: Medicare Other | Source: Ambulatory Visit | Attending: General Surgery | Admitting: General Surgery

## 2015-03-14 DIAGNOSIS — Z79899 Other long term (current) drug therapy: Secondary | ICD-10-CM | POA: Diagnosis not present

## 2015-03-14 DIAGNOSIS — K913 Postprocedural intestinal obstruction: Secondary | ICD-10-CM | POA: Diagnosis not present

## 2015-03-14 DIAGNOSIS — E669 Obesity, unspecified: Secondary | ICD-10-CM | POA: Diagnosis not present

## 2015-03-14 DIAGNOSIS — K219 Gastro-esophageal reflux disease without esophagitis: Secondary | ICD-10-CM | POA: Diagnosis not present

## 2015-03-14 DIAGNOSIS — Z87891 Personal history of nicotine dependence: Secondary | ICD-10-CM | POA: Diagnosis not present

## 2015-03-14 DIAGNOSIS — K449 Diaphragmatic hernia without obstruction or gangrene: Secondary | ICD-10-CM | POA: Insufficient documentation

## 2015-03-14 DIAGNOSIS — Z6831 Body mass index (BMI) 31.0-31.9, adult: Secondary | ICD-10-CM | POA: Diagnosis not present

## 2015-03-14 DIAGNOSIS — Z01818 Encounter for other preprocedural examination: Secondary | ICD-10-CM

## 2015-03-14 DIAGNOSIS — N400 Enlarged prostate without lower urinary tract symptoms: Secondary | ICD-10-CM | POA: Diagnosis not present

## 2015-03-14 DIAGNOSIS — I10 Essential (primary) hypertension: Secondary | ICD-10-CM | POA: Diagnosis not present

## 2015-03-14 DIAGNOSIS — J9811 Atelectasis: Secondary | ICD-10-CM | POA: Diagnosis not present

## 2015-03-14 DIAGNOSIS — J302 Other seasonal allergic rhinitis: Secondary | ICD-10-CM | POA: Diagnosis not present

## 2015-03-14 LAB — BASIC METABOLIC PANEL
Anion gap: 11 (ref 5–15)
BUN: 18 mg/dL (ref 6–20)
CO2: 27 mmol/L (ref 22–32)
Calcium: 9 mg/dL (ref 8.9–10.3)
Chloride: 99 mmol/L — ABNORMAL LOW (ref 101–111)
Creatinine, Ser: 1.15 mg/dL (ref 0.61–1.24)
GFR calc Af Amer: >60 mL/min (ref >60)
GFR calc non Af Amer: >60 mL/min (ref >60)
Glucose, Bld: 115 mg/dL — ABNORMAL HIGH (ref 65–99)
Potassium: 3.7 mmol/L (ref 3.5–5.1)
Sodium: 137 mmol/L (ref 135–145)

## 2015-03-14 LAB — ABO/RH: ABO/RH(D): A POS

## 2015-03-14 LAB — CBC
HCT: 43.2 % (ref 39.0–52.0)
Hemoglobin: 14.5 g/dL (ref 13.0–17.0)
MCH: 29.5 pg (ref 26.0–34.0)
MCHC: 33.6 g/dL (ref 30.0–36.0)
MCV: 87.8 fL (ref 78.0–100.0)
Platelets: 218 10*3/uL (ref 150–400)
RBC: 4.92 MIL/uL (ref 4.22–5.81)
RDW: 13.3 % (ref 11.5–15.5)
WBC: 7.5 10*3/uL (ref 4.0–10.5)

## 2015-03-14 NOTE — Progress Notes (Deleted)
Verified with Joni Reiningicole in Lab that type and screen will need to be redrawn day of surgery.  Joni Reiningicole verified that Type and Screen will need to be redrawn day of surgery due to antibodies.  Called patient and informed him of above and that he could remove blue bracelet.  Patient voiced understanding.

## 2015-03-14 NOTE — Progress Notes (Signed)
EKG- 12/21/14 on chart  12/21/14- LOV Dr Jacinto HalimGanji on chart  ECHO- 12/27/14- chart Stress Test 01/02/15 - chart

## 2015-03-17 ENCOUNTER — Inpatient Hospital Stay (HOSPITAL_COMMUNITY): Payer: Medicare Other

## 2015-03-17 ENCOUNTER — Encounter (HOSPITAL_COMMUNITY)
Admission: RE | Disposition: A | Discharge status: Home / Self Care | Payer: Self-pay | Source: Ambulatory Visit | Attending: General Surgery

## 2015-03-17 ENCOUNTER — Inpatient Hospital Stay (HOSPITAL_COMMUNITY): Payer: Medicare Other | Admitting: Anesthesiology

## 2015-03-17 ENCOUNTER — Encounter (HOSPITAL_COMMUNITY): Payer: Self-pay | Admitting: *Deleted

## 2015-03-17 ENCOUNTER — Inpatient Hospital Stay (HOSPITAL_COMMUNITY)
Admission: RE | Admit: 2015-03-17 | Discharge: 2015-03-23 | DRG: 327 | Disposition: A | Discharge status: Home / Self Care | Payer: Medicare Other | Source: Ambulatory Visit | Attending: General Surgery | Admitting: General Surgery

## 2015-03-17 DIAGNOSIS — E663 Overweight: Secondary | ICD-10-CM | POA: Diagnosis present

## 2015-03-17 DIAGNOSIS — K913 Postprocedural intestinal obstruction: Secondary | ICD-10-CM | POA: Diagnosis not present

## 2015-03-17 DIAGNOSIS — N400 Enlarged prostate without lower urinary tract symptoms: Secondary | ICD-10-CM | POA: Diagnosis present

## 2015-03-17 DIAGNOSIS — Z87891 Personal history of nicotine dependence: Secondary | ICD-10-CM

## 2015-03-17 DIAGNOSIS — E669 Obesity, unspecified: Secondary | ICD-10-CM | POA: Diagnosis not present

## 2015-03-17 DIAGNOSIS — Z6831 Body mass index (BMI) 31.0-31.9, adult: Secondary | ICD-10-CM | POA: Diagnosis not present

## 2015-03-17 DIAGNOSIS — K219 Gastro-esophageal reflux disease without esophagitis: Secondary | ICD-10-CM | POA: Diagnosis present

## 2015-03-17 DIAGNOSIS — Z79899 Other long term (current) drug therapy: Secondary | ICD-10-CM | POA: Diagnosis not present

## 2015-03-17 DIAGNOSIS — Z9889 Other specified postprocedural states: Secondary | ICD-10-CM

## 2015-03-17 DIAGNOSIS — R0602 Shortness of breath: Secondary | ICD-10-CM

## 2015-03-17 DIAGNOSIS — I10 Essential (primary) hypertension: Secondary | ICD-10-CM | POA: Diagnosis not present

## 2015-03-17 DIAGNOSIS — K449 Diaphragmatic hernia without obstruction or gangrene: Secondary | ICD-10-CM | POA: Diagnosis not present

## 2015-03-17 DIAGNOSIS — IMO0001 Reserved for inherently not codable concepts without codable children: Secondary | ICD-10-CM | POA: Diagnosis present

## 2015-03-17 DIAGNOSIS — J302 Other seasonal allergic rhinitis: Secondary | ICD-10-CM

## 2015-03-17 DIAGNOSIS — J984 Other disorders of lung: Secondary | ICD-10-CM | POA: Diagnosis not present

## 2015-03-17 DIAGNOSIS — T888XXA Other specified complications of surgical and medical care, not elsewhere classified, initial encounter: Secondary | ICD-10-CM

## 2015-03-17 DIAGNOSIS — J90 Pleural effusion, not elsewhere classified: Secondary | ICD-10-CM | POA: Diagnosis not present

## 2015-03-17 DIAGNOSIS — J438 Other emphysema: Secondary | ICD-10-CM | POA: Diagnosis not present

## 2015-03-17 DIAGNOSIS — R140 Abdominal distension (gaseous): Secondary | ICD-10-CM

## 2015-03-17 DIAGNOSIS — R918 Other nonspecific abnormal finding of lung field: Secondary | ICD-10-CM | POA: Diagnosis not present

## 2015-03-17 DIAGNOSIS — R531 Weakness: Secondary | ICD-10-CM | POA: Diagnosis not present

## 2015-03-17 DIAGNOSIS — J9811 Atelectasis: Secondary | ICD-10-CM | POA: Diagnosis not present

## 2015-03-17 HISTORY — DX: Diaphragmatic hernia without obstruction or gangrene: K44.9

## 2015-03-17 HISTORY — DX: Obesity, unspecified: E66.9

## 2015-03-17 HISTORY — PX: HIATAL HERNIA REPAIR: SHX195

## 2015-03-17 HISTORY — PX: INSERTION OF MESH: SHX5868

## 2015-03-17 LAB — TYPE AND SCREEN
ABO/RH(D): A POS
Antibody Screen: NEGATIVE

## 2015-03-17 SURGERY — FUNDOPLICATION, NISSEN, ROBOT-ASSISTED, LAPAROSCOPIC
Anesthesia: General | Site: Abdomen

## 2015-03-17 MED ORDER — CISATRACURIUM BESYLATE 20 MG/10ML IV SOLN
INTRAVENOUS | Status: AC
  Filled 2015-03-17: qty 10

## 2015-03-17 MED ORDER — OXYCODONE HCL 5 MG/5ML PO SOLN
5.0000 mg | Freq: Once | ORAL | Status: DC | PRN
  Filled 2015-03-17: qty 5

## 2015-03-17 MED ORDER — ALBUMIN HUMAN 25 % IV SOLN
INTRAVENOUS | Status: DC | PRN
  Administered 2015-03-17: 13:00:00 via INTRAVENOUS

## 2015-03-17 MED ORDER — GLYCOPYRROLATE 0.2 MG/ML IJ SOLN
INTRAMUSCULAR | Status: DC | PRN
  Administered 2015-03-17: .8 mg via INTRAVENOUS

## 2015-03-17 MED ORDER — SODIUM CHLORIDE 0.9 % IJ SOLN
INTRAMUSCULAR | Status: AC
  Filled 2015-03-17: qty 10

## 2015-03-17 MED ORDER — PHENYLEPHRINE HCL 10 MG/ML IJ SOLN
INTRAMUSCULAR | Status: DC | PRN
  Administered 2015-03-17: 160 ug via INTRAVENOUS
  Administered 2015-03-17: 120 ug via INTRAVENOUS

## 2015-03-17 MED ORDER — SUCCINYLCHOLINE CHLORIDE 20 MG/ML IJ SOLN
INTRAMUSCULAR | Status: DC | PRN
  Administered 2015-03-17: 100 mg via INTRAVENOUS

## 2015-03-17 MED ORDER — PROMETHAZINE HCL 25 MG/ML IJ SOLN: 6.2500 mg | INTRAMUSCULAR | Status: DC | PRN

## 2015-03-17 MED ORDER — DEXTROSE 5 % IV SOLN
10.0000 mg | INTRAVENOUS | Status: DC | PRN
  Administered 2015-03-17: 15 ug/min via INTRAVENOUS

## 2015-03-17 MED ORDER — HYDROMORPHONE HCL 1 MG/ML IJ SOLN
INTRAMUSCULAR | Status: DC | PRN
  Administered 2015-03-17: .2 mg via INTRAVENOUS
  Administered 2015-03-17: .4 mg via INTRAVENOUS

## 2015-03-17 MED ORDER — DEXTROSE-NACL 5-0.9 % IV SOLN
INTRAVENOUS | Status: DC
  Administered 2015-03-17 – 2015-03-19 (×4): via INTRAVENOUS
  Administered 2015-03-19: 50 mL/h via INTRAVENOUS
  Administered 2015-03-20: 06:00:00 via INTRAVENOUS
  Administered 2015-03-21: 1000 mL via INTRAVENOUS

## 2015-03-17 MED ORDER — CHLORHEXIDINE GLUCONATE 4 % EX LIQD: 1.0000 "application " | Freq: Once | CUTANEOUS | Status: DC

## 2015-03-17 MED ORDER — CEFAZOLIN SODIUM-DEXTROSE 2-3 GM-% IV SOLR
INTRAVENOUS | Status: AC
  Filled 2015-03-17: qty 50

## 2015-03-17 MED ORDER — TISSEEL VH 10 ML EX KIT
PACK | CUTANEOUS | Status: AC
  Filled 2015-03-17: qty 1

## 2015-03-17 MED ORDER — PHENYLEPHRINE 40 MCG/ML (10ML) SYRINGE FOR IV PUSH (FOR BLOOD PRESSURE SUPPORT)
PREFILLED_SYRINGE | INTRAVENOUS | Status: AC
  Filled 2015-03-17: qty 10

## 2015-03-17 MED ORDER — ALBUMIN HUMAN 5 % IV SOLN
INTRAVENOUS | Status: AC
  Filled 2015-03-17: qty 250

## 2015-03-17 MED ORDER — NEOSTIGMINE METHYLSULFATE 10 MG/10ML IV SOLN
INTRAVENOUS | Status: DC | PRN
  Administered 2015-03-17: 5 mg via INTRAVENOUS

## 2015-03-17 MED ORDER — PHENYLEPHRINE HCL 10 MG/ML IJ SOLN
INTRAMUSCULAR | Status: AC
  Filled 2015-03-17: qty 1

## 2015-03-17 MED ORDER — METOCLOPRAMIDE HCL 5 MG/ML IJ SOLN
INTRAMUSCULAR | Status: AC
  Filled 2015-03-17: qty 2

## 2015-03-17 MED ORDER — CEFAZOLIN SODIUM-DEXTROSE 2-3 GM-% IV SOLR
2.0000 g | INTRAVENOUS | Status: AC
  Administered 2015-03-17: 2 g via INTRAVENOUS

## 2015-03-17 MED ORDER — GLYCOPYRROLATE 0.2 MG/ML IJ SOLN
INTRAMUSCULAR | Status: AC
  Filled 2015-03-17: qty 4

## 2015-03-17 MED ORDER — FENTANYL CITRATE (PF) 100 MCG/2ML IJ SOLN
INTRAMUSCULAR | Status: DC | PRN
  Administered 2015-03-17: 50 ug via INTRAVENOUS
  Administered 2015-03-17: 100 ug via INTRAVENOUS
  Administered 2015-03-17: 50 ug via INTRAVENOUS

## 2015-03-17 MED ORDER — 0.9 % SODIUM CHLORIDE (POUR BTL) OPTIME
TOPICAL | Status: DC | PRN
  Administered 2015-03-17: 1000 mL

## 2015-03-17 MED ORDER — LACTATED RINGERS IV SOLN: INTRAVENOUS | Status: DC

## 2015-03-17 MED ORDER — PROPOFOL 10 MG/ML IV BOLUS
INTRAVENOUS | Status: AC
  Filled 2015-03-17: qty 20

## 2015-03-17 MED ORDER — HYDROMORPHONE HCL 1 MG/ML IJ SOLN
0.2500 mg | INTRAMUSCULAR | Status: DC | PRN
  Administered 2015-03-17 (×2): 0.5 mg via INTRAVENOUS

## 2015-03-17 MED ORDER — BUPIVACAINE-EPINEPHRINE 0.25% -1:200000 IJ SOLN
INTRAMUSCULAR | Status: DC | PRN
  Administered 2015-03-17: 15 mL

## 2015-03-17 MED ORDER — PROPOFOL 10 MG/ML IV BOLUS
INTRAVENOUS | Status: DC | PRN
  Administered 2015-03-17: 160 mg via INTRAVENOUS

## 2015-03-17 MED ORDER — HYDROCODONE-ACETAMINOPHEN 7.5-325 MG/15ML PO SOLN: 15.0000 mL | Freq: Four times a day (QID) | ORAL | Status: AC | PRN

## 2015-03-17 MED ORDER — FENTANYL CITRATE (PF) 250 MCG/5ML IJ SOLN
INTRAMUSCULAR | Status: AC
  Filled 2015-03-17: qty 5

## 2015-03-17 MED ORDER — LACTATED RINGERS IR SOLN
Status: DC | PRN
  Administered 2015-03-17: 1000 mL

## 2015-03-17 MED ORDER — ENOXAPARIN SODIUM 40 MG/0.4ML ~~LOC~~ SOLN
40.0000 mg | SUBCUTANEOUS | Status: DC
  Administered 2015-03-18 – 2015-03-22 (×5): 40 mg via SUBCUTANEOUS
  Filled 2015-03-17 (×8): qty 0.4

## 2015-03-17 MED ORDER — LACTATED RINGERS IV SOLN
INTRAVENOUS | Status: DC | PRN
  Administered 2015-03-17 (×3): via INTRAVENOUS

## 2015-03-17 MED ORDER — OXYCODONE HCL 5 MG PO TABS: 5.0000 mg | ORAL_TABLET | Freq: Once | ORAL | Status: DC | PRN

## 2015-03-17 MED ORDER — HYDROMORPHONE HCL 2 MG/ML IJ SOLN
INTRAMUSCULAR | Status: AC
  Filled 2015-03-17: qty 1

## 2015-03-17 MED ORDER — DEXAMETHASONE SODIUM PHOSPHATE 10 MG/ML IJ SOLN
INTRAMUSCULAR | Status: AC
  Filled 2015-03-17: qty 1

## 2015-03-17 MED ORDER — ONDANSETRON HCL 4 MG/2ML IJ SOLN: 4.0000 mg | Freq: Four times a day (QID) | INTRAMUSCULAR | Status: DC | PRN

## 2015-03-17 MED ORDER — MIDAZOLAM HCL 5 MG/5ML IJ SOLN
INTRAMUSCULAR | Status: DC | PRN
  Administered 2015-03-17: 2 mg via INTRAVENOUS

## 2015-03-17 MED ORDER — DEXAMETHASONE SODIUM PHOSPHATE 10 MG/ML IJ SOLN
INTRAMUSCULAR | Status: DC | PRN
  Administered 2015-03-17: 10 mg via INTRAVENOUS

## 2015-03-17 MED ORDER — CEFAZOLIN SODIUM-DEXTROSE 2-3 GM-% IV SOLR
2.0000 g | Freq: Once | INTRAVENOUS | Status: AC
  Administered 2015-03-17: 2 g via INTRAVENOUS

## 2015-03-17 MED ORDER — HYDROMORPHONE HCL 1 MG/ML IJ SOLN
INTRAMUSCULAR | Status: AC
Start: 2015-03-17 — End: 2015-03-18
  Filled 2015-03-17: qty 1

## 2015-03-17 MED ORDER — STERILE WATER FOR IRRIGATION IR SOLN
Status: DC | PRN
  Administered 2015-03-17: 1500 mL

## 2015-03-17 MED ORDER — ONDANSETRON HCL 4 MG/2ML IJ SOLN
INTRAMUSCULAR | Status: AC
  Filled 2015-03-17: qty 2

## 2015-03-17 MED ORDER — HYDROMORPHONE HCL 1 MG/ML IJ SOLN
1.0000 mg | INTRAMUSCULAR | Status: DC | PRN
  Administered 2015-03-17 – 2015-03-18 (×3): 1 mg via INTRAVENOUS
  Filled 2015-03-17 (×3): qty 1

## 2015-03-17 MED ORDER — BUPIVACAINE-EPINEPHRINE (PF) 0.25% -1:200000 IJ SOLN
INTRAMUSCULAR | Status: AC
  Filled 2015-03-17: qty 30

## 2015-03-17 MED ORDER — CISATRACURIUM BESYLATE (PF) 10 MG/5ML IV SOLN
INTRAVENOUS | Status: DC | PRN
  Administered 2015-03-17 (×2): 5 mg via INTRAVENOUS
  Administered 2015-03-17: 7 mg via INTRAVENOUS
  Administered 2015-03-17 (×4): 4 mg via INTRAVENOUS
  Administered 2015-03-17: 5 mg via INTRAVENOUS

## 2015-03-17 MED ORDER — MIDAZOLAM HCL 2 MG/2ML IJ SOLN
INTRAMUSCULAR | Status: AC
  Filled 2015-03-17: qty 2

## 2015-03-17 SURGICAL SUPPLY — 66 items
APPLIER CLIP 5 13 M/L LIGAMAX5 (MISCELLANEOUS)
APPLIER CLIP ROT 10 11.4 M/L (STAPLE)
BENZOIN TINCTURE PRP APPL 2/3 (GAUZE/BANDAGES/DRESSINGS) ×3 IMPLANT
BLADE SURG SZ11 CARB STEEL (BLADE) ×3 IMPLANT
CATH COUDE 22FR 5CC (CATHETERS) ×2 IMPLANT
CATH RIBBED COUDE 30CC (CATHETERS) ×1
CHLORAPREP W/TINT 26ML (MISCELLANEOUS) ×3 IMPLANT
CLIP APPLIE 5 13 M/L LIGAMAX5 (MISCELLANEOUS) IMPLANT
CLIP APPLIE ROT 10 11.4 M/L (STAPLE) IMPLANT
CLIP LIGATING HEM O LOK PURPLE (MISCELLANEOUS) IMPLANT
CLIP LIGATING HEMO O LOK GREEN (MISCELLANEOUS) IMPLANT
COVER TIP SHEARS 8 DVNC (MISCELLANEOUS) ×2 IMPLANT
COVER TIP SHEARS 8MM DA VINCI (MISCELLANEOUS) ×1
DECANTER SPIKE VIAL GLASS SM (MISCELLANEOUS) ×3 IMPLANT
DEVICE TROCAR PUNCTURE CLOSURE (ENDOMECHANICALS) IMPLANT
DISSECTOR BLUNT TIP ENDO 5MM (MISCELLANEOUS) IMPLANT
DRAIN PENROSE 18X1/2 LTX STRL (DRAIN) ×3 IMPLANT
DRAPE ARM DVNC X/XI (DISPOSABLE) ×8 IMPLANT
DRAPE COLUMN DVNC XI (DISPOSABLE) ×2 IMPLANT
DRAPE DA VINCI XI ARM (DISPOSABLE) ×4
DRAPE DA VINCI XI COLUMN (DISPOSABLE) ×1
ELECT REM PT RETURN 9FT ADLT (ELECTROSURGICAL) ×3
ELECTRODE REM PT RTRN 9FT ADLT (ELECTROSURGICAL) ×2 IMPLANT
ENDOLOOP SUT PDS II  0 18 (SUTURE) ×1
ENDOLOOP SUT PDS II 0 18 (SUTURE) ×2 IMPLANT
GAUZE SPONGE 2X2 8PLY STRL LF (GAUZE/BANDAGES/DRESSINGS) ×2 IMPLANT
GAUZE SPONGE 4X4 16PLY XRAY LF (GAUZE/BANDAGES/DRESSINGS) ×3 IMPLANT
GLOVE BIO SURGEON STRL SZ7.5 (GLOVE) ×6 IMPLANT
GOWN STRL REUS W/TWL XL LVL3 (GOWN DISPOSABLE) ×18 IMPLANT
KIT BASIN OR (CUSTOM PROCEDURE TRAY) ×3 IMPLANT
LIQUID BAND (GAUZE/BANDAGES/DRESSINGS) ×3 IMPLANT
MESH HERNIA 7X10 (Mesh General) ×3 IMPLANT
NEEDLE HYPO 22GX1.5 SAFETY (NEEDLE) ×3 IMPLANT
NEEDLE INSUFFLATION 14GA 120MM (NEEDLE) ×3 IMPLANT
PACK CARDIOVASCULAR III (CUSTOM PROCEDURE TRAY) ×3 IMPLANT
PAD POSITIONING PINK XL (MISCELLANEOUS) IMPLANT
PEN SKIN MARKING BROAD (MISCELLANEOUS) ×3 IMPLANT
RELOAD STAPLE HERNIA 4.0 BLUE (INSTRUMENTS) IMPLANT
RELOAD STAPLE HERNIA 4.8 BLK (STAPLE) IMPLANT
SCISSORS LAP 5X35 DISP (ENDOMECHANICALS) ×3 IMPLANT
SEAL CANN UNIV 5-8 DVNC XI (MISCELLANEOUS) ×8 IMPLANT
SEAL XI 5MM-8MM UNIVERSAL (MISCELLANEOUS) ×4
SEALER VESSEL DA VINCI XI (MISCELLANEOUS) ×1
SEALER VESSEL EXT DVNC XI (MISCELLANEOUS) ×2 IMPLANT
SET IRRIG TUBING LAPAROSCOPIC (IRRIGATION / IRRIGATOR) ×3 IMPLANT
SHEARS HARMONIC ACE PLUS 36CM (ENDOMECHANICALS) IMPLANT
SLEEVE XCEL OPT CAN 5 100 (ENDOMECHANICALS) ×3 IMPLANT
SOLUTION ANTI FOG 6CC (MISCELLANEOUS) ×3 IMPLANT
SOLUTION ELECTROLUBE (MISCELLANEOUS) ×3 IMPLANT
SPONGE GAUZE 2X2 STER 10/PKG (GAUZE/BANDAGES/DRESSINGS) ×1
STAPLER HERNIA 12 8.5 360D (INSTRUMENTS) IMPLANT
STAPLER VISISTAT 35W (STAPLE) IMPLANT
STRIP CLOSURE SKIN 1/2X4 (GAUZE/BANDAGES/DRESSINGS) ×3 IMPLANT
SUT ETHIBOND 2 0 SH (SUTURE) ×4
SUT ETHIBOND 2 0 SH 36X2 (SUTURE) ×8 IMPLANT
SUT MNCRL AB 4-0 PS2 18 (SUTURE) ×3 IMPLANT
SUT SILK 2 0 SH (SUTURE) ×6 IMPLANT
SUT SILK 3 0 SH 30 (SUTURE) IMPLANT
SYR 20CC LL (SYRINGE) ×3 IMPLANT
SYRINGE 10CC LL (SYRINGE) ×3 IMPLANT
TAPE CLOTH SURG 4X10 WHT LF (GAUZE/BANDAGES/DRESSINGS) ×3 IMPLANT
TOWEL OR 17X26 10 PK STRL BLUE (TOWEL DISPOSABLE) ×3 IMPLANT
TOWEL OR NON WOVEN STRL DISP B (DISPOSABLE) ×3 IMPLANT
TRAY FOLEY W/METER SILVER 14FR (SET/KITS/TRAYS/PACK) ×3 IMPLANT
TROCAR BLADELESS OPT 5 100 (ENDOMECHANICALS) ×3 IMPLANT
TUBING FILTER THERMOFLATOR (ELECTROSURGICAL) ×3 IMPLANT

## 2015-03-17 NOTE — Transfer of Care (Signed)
Immediate Anesthesia Transfer of Care Note  Patient: Christopher Mata  Procedure(s) Performed: Procedure(s): XI ROBOTIC ASSISTED LAPAROSCOPIC NISSEN FUNDOPLICATION (N/A) LAPAROSCOPIC REPAIR OF HIATAL HERNIA INSERTION OF MESH  Patient Location: PACU  Anesthesia Type:General  Level of Consciousness: awake and oriented  Airway & Oxygen Therapy: Patient Spontanous Breathing and Patient connected to face mask oxygen  Post-op Assessment: Report given to RN and Post -op Vital signs reviewed and stable  Post vital signs: Reviewed and stable  Last Vitals:  Filed Vitals:   03/17/15 0506  BP: 111/85  Pulse: 76  Temp: 36.7 C  Resp: 18    Complications: No apparent anesthesia complications

## 2015-03-17 NOTE — Anesthesia Preprocedure Evaluation (Addendum)
Anesthesia Evaluation  Patient identified by MRN, date of birth, ID band Patient awake    Reviewed: Allergy & Precautions, NPO status , Patient's Chart, lab work & pertinent test results  Airway Mallampati: II  TM Distance: >3 FB Neck ROM: Full    Dental   Pulmonary former smoker,  breath sounds clear to auscultation        Cardiovascular hypertension, Pt. on medications Rhythm:Regular Rate:Normal     Neuro/Psych negative neurological ROS     GI/Hepatic Neg liver ROS, hiatal hernia, GERD-  ,  Endo/Other  Morbid obesity  Renal/GU negative Renal ROS     Musculoskeletal   Abdominal   Peds  Hematology negative hematology ROS (+)   Anesthesia Other Findings   Reproductive/Obstetrics                            Anesthesia Physical Anesthesia Plan  ASA: III  Anesthesia Plan: General   Post-op Pain Management:    Induction: Intravenous  Airway Management Planned: Oral ETT  Additional Equipment:   Intra-op Plan:   Post-operative Plan: Extubation in OR  Informed Consent: I have reviewed the patients History and Physical, chart, labs and discussed the procedure including the risks, benefits and alternatives for the proposed anesthesia with the patient or authorized representative who has indicated his/her understanding and acceptance.   Dental advisory given  Plan Discussed with: CRNA  Anesthesia Plan Comments:         Anesthesia Quick Evaluation

## 2015-03-17 NOTE — Anesthesia Postprocedure Evaluation (Signed)
  Anesthesia Post-op Note  Patient: Christopher Mata  Procedure(s) Performed: Procedure(s): XI ROBOTIC ASSISTED LAPAROSCOPIC NISSEN FUNDOPLICATION (N/A) LAPAROSCOPIC REPAIR OF HIATAL HERNIA INSERTION OF MESH  Patient Location: PACU  Anesthesia Type:General  Level of Consciousness: awake, alert  and oriented  Airway and Oxygen Therapy: Patient Spontanous Breathing  Post-op Pain: mild  Post-op Assessment: Post-op Vital signs reviewed              Post-op Vital Signs: Reviewed  Last Vitals:  Filed Vitals:   03/17/15 1439  BP: 116/72  Pulse: 89  Temp: 36.3 C  Resp: 16    Complications: No apparent anesthesia complications

## 2015-03-17 NOTE — Discharge Instructions (Signed)
EATING AFTER YOUR ESOPHAGEAL SURGERY °(Stomach Fundoplication, Hiatal Hernia repair, Achalasia surgery, etc) ° °After your esophageal surgery, expect some sticking with swallowing over the next 1-2 months.   ° °If food sticks when you eat, it is called "dysphagia".  This is due to swelling around your esophagus at the wrap & hiatal diaphragm repair.  It will gradually ease off over the next few months.  To help you through this temporary phase, we start you out on a pureed (blenderized) diet. ° °Your first meal in the hospital was thin liquids.  You should have been given a pureed diet by the time you left the hospital.  We ask patients to stay on a pureed diet for the first 2-3 weeks to avoid anything getting "stuck" near your recent surgery.  Don't be alarmed if your ability to swallow doesn't progress according to this plan.  Everyone is different and some diets can advance more or less quickly.   ° ° °Some BASIC RULES to follow are: °· Maintain an upright position whenever eating or drinking. °· Take small bites - just a teaspoon size bite at a time. °· Eat slowly.  It may also help to eat only one food at a time. °· Consider nibbling through smaller, more frequent meals & avoid the urge to eat BIG meals °· Do not push through feelings of fullness, nausea, or bloatedness °· Do not mix solid foods and liquids in the same mouthful °· Try not to "wash foods down" with large gulps of liquids. °Avoid carbonated (bubbly/fizzy) drinks.  Understand that it will be hard to burp and belch at first.  This gradually improves with time.  Expect to be more gassy/flatulent/bloated initially.  Walking will help you work through that.  Maalox/Gas-X can help as well. °· Eat in a relaxed atmosphere & minimize distractions. °· Avoid talking while eating.   °· Do not use straws. °· Following each meal, sit in an upright position (90 degree angle) for 60 to 90 minutes.  Going for a short walk can help as well °· If food does stick,  don't panic.  Try to relax and let the food pass on its own.  Sipping WARM LIQUID such as strong hot black tea can also help slide it down. ° ° °Be gradual in changes & use common sense: ° °-If you easily tolerating a certain "level" of foods, advance to the next level gradually °-If you are having trouble swallowing a particular food, then avoid it.   °-If food is sticking when you advance your diet, go back to thinner previous diet (the lower LEVEL) for 1-2 days. ° °LEVEL 1 = PUREED DIET ° °Do for the first 2 WEEKS AFTER SURGERY ° °-Foods in this group are pureed or blenderized to a smooth, mashed potato-like consistency.  °-If necessary, the pureed foods can keep their shape with the addition of a thickening agent.   °-Meat should be pureed to a smooth, pasty consistency.  Hot broth or gravy may be added to the pureed meat, approximately 1 oz. of liquid per 3 oz. serving of meat. °-CAUTION:  If any foods do not puree into a smooth consistency, swallowing will be more difficult.  (For example, nuts or seeds sometimes do not blend well.) ° °Hot Foods Cold Foods  °Pureed scrambled eggs and cheese Pureed cottage cheese  °Baby cereals Thickened juices and nectars  °Thinned cooked cereals (no lumps) Thickened milk or eggnog  °Pureed French toast or pancakes Ensure  °Mashed   potatoes Ice cream  °Pureed parsley, au gratin, scalloped potatoes, candied sweet potatoes Fruit or Italian ice, sherbet  °Pureed buttered or alfredo noodles Plain yogurt  °Pureed vegetables (no corn or peas) Instant breakfast  °Pureed soups and creamed soups Smooth pudding, mousse, custard  °Pureed scalloped apples Whipped gelatin  °Gravies Sugar, syrup, honey, jelly  °Sauces, cheese, tomato, barbecue, white, creamed Cream  °Any baby food Creamer  °Alcohol in moderation (not beer or champagne) Margarine  °Coffee or tea Mayonnaise  ° Ketchup, mustard  ° Apple sauce  ° °SAMPLE MENU:  PUREED DIET °Breakfast Lunch Dinner  °· Orange juice, 1/2  cup °· Cream of wheat, 1/2 cup · Pineapple juice, 1/2 cup · Pureed turkey, barley soup, 3/4 cup °· Pureed Hawaiian chicken, 3 oz  °· Scrambled eggs, mashed or blended with cheese, 1/2 cup °· Tea or coffee, 1 cup  °· Whole milk, 1 cup  °· Non-dairy creamer, 2 Tbsp. · Mashed potatoes, 1/2 cup °· Pureed cooled broccoli, 1/2 cup °· Apple sauce, 1/2 cup °· Coffee or tea · Mashed potatoes, 1/2 cup °· Pureed spinach, 1/2 cup °· Frozen yogurt, 1/2 cup °· Tea or coffee  ° ° ° ° °LEVEL 2 = SOFT DIET ° °After your first 2 weeks, you can advance to a soft diet.   °Keep on this diet until everything goes down easily. ° °Hot Foods Cold Foods  °White fish Cottage cheese  °Stuffed fish Junior baby fruit  °Baby food meals Semi thickened juices  °Minced soft cooked, scrambled, poached eggs nectars  °Souffle & omelets Ripe mashed bananas  °Cooked cereals Canned fruit, pineapple sauce, milk  °potatoes Milkshake  °Buttered or Alfredo noodles Custard  °Cooked cooled vegetable Puddings, including tapioca  °Sherbet Yogurt  °Vegetable soup or alphabet soup Fruit ice, Italian ice  °Gravies Whipped gelatin  °Sugar, syrup, honey, jelly Junior baby desserts  °Sauces:  Cheese, creamed, barbecue, tomato, white Cream  °Coffee or tea Margarine  ° °SAMPLE MENU:  LEVEL 2 °Breakfast Lunch Dinner  °· Orange juice, 1/2 cup °· Oatmeal, 1/2 cup °· Scrambled eggs with cheese, 1/2 cup °· Decaffeinated tea, 1 cup °· Whole milk, 1 cup °· Non-dairy creamer, 2 Tbsp · Pineapple juice, 1/2 cup °· Minced beef, 3 oz °· Gravy, 2 Tbsp °· Mashed potatoes, 1/2 cup °· Minced fresh broccoli, 1/2 cup °· Applesauce, 1/2 cup °· Coffee, 1 cup · Turkey, barley soup, 3/4 cup °· Minced Hawaiian chicken, 3 oz °· Mashed potatoes, 1/2 cup °· Cooked spinach, 1/2 cup °· Frozen yogurt, 1/2 cup °· Non-dairy creamer, 2 Tbsp  ° ° ° ° °LEVEL 3 = CHOPPED DIET ° °-After all the foods in level 2 (soft diet) are passing through well you should advance up to more chopped foods.  °-It is still  important to cut these foods into small pieces and eat slowly. ° °Hot Foods Cold Foods  °Poultry Cottage cheese  °Chopped Swedish meatballs Yogurt  °Meat salads (ground or flaked meat) Milk  °Flaked fish (tuna) Milkshakes  °Poached or scrambled eggs Soft, cold, dry cereal  °Souffles and omelets Fruit juices or nectars  °Cooked cereals Chopped canned fruit  °Chopped French toast or pancakes Canned fruit cocktail  °Noodles or pasta (no rice) Pudding, mousse, custard  °Cooked vegetables (no frozen peas, corn, or mixed vegetables) Green salad  °Canned small sweet peas Ice cream  °Creamed soup or vegetable soup Fruit ice, Italian ice  °Pureed vegetable soup or alphabet soup Non-dairy creamer  °Ground scalloped   apples Margarine  °Gravies Mayonnaise  °Sauces:  Cheese, creamed, barbecue, tomato, white Ketchup  °Coffee or tea Mustard  ° °SAMPLE MENU:  LEVEL 3 °Breakfast Lunch Dinner  °· Orange juice, 1/2 cup °· Oatmeal, 1/2 cup °· Scrambled eggs with cheese, 1/2 cup °· Decaffeinated tea, 1 cup °· Whole milk, 1 cup °· Non-dairy creamer, 2 Tbsp °· Ketchup, 1 Tbsp °· Margarine, 1 tsp °· Salt, 1/4 tsp °· Sugar, 2 tsp · Pineapple juice, 1/2 cup °· Ground beef, 3 oz °· Gravy, 2 Tbsp °· Mashed potatoes, 1/2 cup °· Cooked spinach, 1/2 cup °· Applesauce, 1/2 cup °· Decaffeinated coffee °· Whole milk °· Non-dairy creamer, 2 Tbsp °· Margarine, 1 tsp °· Salt, 1/4 tsp · Pureed turkey, barley soup, 3/4 cup °· Barbecue chicken, 3 oz °· Mashed potatoes, 1/2 cup °· Ground fresh broccoli, 1/2 cup °· Frozen yogurt, 1/2 cup °· Decaffeinated tea, 1 cup °· Non-dairy creamer, 2 Tbsp °· Margarine, 1 tsp °· Salt, 1/4 tsp °· Sugar, 1 tsp  ° ° °LEVEL 4:  REGULAR FOODS ° °-Foods in this group are soft, moist, regularly textured foods.   °-This level includes meat and breads, which tend to be the hardest things to swallow.   °-Eat very slowly, chew well and continue to avoid carbonated drinks. °-most people are at this level in 4-6 weeks ° °Hot Foods  Cold Foods  °Baked fish or skinned Soft cheeses - cottage cheese  °Souffles and omelets Cream cheese  °Eggs Yogurt  °Stuffed shells Milk  °Spaghetti with meat sauce Milkshakes  °Cooked cereal Cold dry cereals (no nuts, dried fruit, coconut)  °French toast or pancakes Crackers  °Buttered toast Fruit juices or nectars  °Noodles or pasta (no rice) Canned fruit  °Potatoes (all types) Ripe bananas  °Soft, cooked vegetables (no corn, lima, or baked beans) Peeled, ripe, fresh fruit  °Creamed soups or vegetable soup Cakes (no nuts, dried fruit, coconut)  °Canned chicken noodle soup Plain doughnuts  °Gravies Ice cream  °Bacon dressing Pudding, mousse, custard  °Sauces:  Cheese, creamed, barbecue, tomato, white Fruit ice, Italian ice, sherbet  °Decaffeinated tea or coffee Whipped gelatin  °Pork chops Regular gelatin  ° Canned fruited gelatin molds  ° Sugar, syrup, honey, jam, jelly  ° Cream  ° Non-dairy  ° Margarine  ° Oil  ° Mayonnaise  ° Ketchup  ° Mustard  ° ° °If you have any questions please call our office at [facility]: [phone]. ° °

## 2015-03-17 NOTE — H&P (View-Only) (Signed)
History of Present Illness Christopher Filler MD; 02/13/2015 9:22 AM) Patient words: hernia.  The patient is a 69 year old male who presents for an evaluation of a hernia. Patient is a 69 year old male who is referred by Dr. Elnoria Howard for evaluation of a hiatal hernia. The patient states he is had a long history of known he's had a hiatal hernia. He states become more symptomatic recently. He recently saw Dr. Nadara Eaton for shortness of breath. Upon his workup he noticed that he did have a large hiatal hernia which was likely contributing to his shortness of breath. Patient underwent further evaluation by Dr. Elnoria Howard which included an upper GI study as well as a upper endoscopy. His upper GI study confirmed a large left-sided hiatal hernia containing a large majority of the stomach. The patient states that he notices symptoms when walking uphill feel short of breath. The patient is currently retired. He does not do any heavy lifting. He does not smoke.   Other Problems Christopher Mata, CMA; 02/13/2015 8:54 AM) Enlarged Prostate Gastric Ulcer Gastroesophageal Reflux Disease Hemorrhoids Hypercholesterolemia Inguinal Hernia  Past Surgical History Christopher Mata, CMA; 02/13/2015 8:54 AM) Colon Polyp Removal - Colonoscopy Colon Polyp Removal - Open  Diagnostic Studies History Christopher Mata, CMA; 02/13/2015 8:54 AM) Colonoscopy 1-5 years ago  Allergies Christopher Mata, CMA; 02/13/2015 8:55 AM) No Known Drug Allergies05/06/2015  Medication History (Christopher Mata, CMA; 02/13/2015 8:55 AM) Atorvastatin Calcium (20MG  Tablet, Oral) Active. Finasteride (5MG  Tablet, Oral) Active. Fluticasone Propionate (50MCG/ACT Suspension, Nasal) Active. Metoprolol Tartrate (25MG  Tablet, Oral) Active. Omeprazole (20MG  Capsule DR, Oral) Active. Valsartan-Hydrochlorothiazide (80-12.5MG  Tablet, Oral) Active. Medications Reconciled  Social History Christopher Mata, CMA; 02/13/2015 8:54 AM) Alcohol use Moderate alcohol  use. Caffeine use Coffee. No drug use Tobacco use Former smoker.  Family History Christopher Mata, CMA; 02/13/2015 8:54 AM) Alcohol Abuse Father. Arthritis Mother. Cerebrovascular Accident Father, Mother. Colon Cancer Mother. Heart Disease Mother.  Review of Systems Christopher Mata CMA; 02/13/2015 8:54 AM) General Not Present- Appetite Loss, Chills, Fatigue, Fever, Night Sweats, Weight Gain and Weight Loss. Skin Not Present- Change in Wart/Mole, Dryness, Hives, Jaundice, New Lesions, Non-Healing Wounds, Rash and Ulcer. HEENT Present- Wears glasses/contact lenses. Not Present- Earache, Hearing Loss, Hoarseness, Nose Bleed, Oral Ulcers, Ringing in the Ears, Seasonal Allergies, Sinus Pain, Sore Throat, Visual Disturbances and Yellow Eyes. Respiratory Present- Chronic Cough, Difficulty Breathing, Snoring and Wheezing. Not Present- Bloody sputum. Breast Not Present- Breast Mass, Breast Pain, Nipple Discharge and Skin Changes. Cardiovascular Present- Shortness of Breath. Not Present- Chest Pain, Difficulty Breathing Lying Down, Leg Cramps, Palpitations, Rapid Heart Rate and Swelling of Extremities. Gastrointestinal Not Present- Abdominal Pain, Bloating, Bloody Stool, Change in Bowel Habits, Chronic diarrhea, Constipation, Difficulty Swallowing, Excessive gas, Gets full quickly at meals, Hemorrhoids, Indigestion, Nausea, Rectal Pain and Vomiting. Male Genitourinary Present- Frequency and Nocturia. Not Present- Blood in Urine, Change in Urinary Stream, Impotence, Painful Urination, Urgency and Urine Leakage. Musculoskeletal Not Present- Back Pain, Joint Pain, Joint Stiffness, Muscle Pain, Muscle Weakness and Swelling of Extremities. Neurological Not Present- Decreased Memory, Fainting, Headaches, Numbness, Seizures, Tingling, Tremor, Trouble walking and Weakness. Psychiatric Not Present- Anxiety, Bipolar, Change in Sleep Pattern, Depression, Fearful and Frequent crying. Endocrine Not Present- Cold  Intolerance, Excessive Hunger, Hair Changes, Heat Intolerance, Hot flashes and New Diabetes.   Vitals (Christopher Mata CMA; 02/13/2015 8:55 AM) 02/13/2015 8:55 AM Weight: 229 lb Height: 71in Body Surface Area: 2.28 m Body Mass Index: 31.94 kg/m Temp.: 97.50F(Temporal)  Pulse: 78 (Regular)  BP: 128/80 (Sitting, Left  Arm, Standard)    Physical Exam Christopher Mata(Daton Szilagyi MD; 02/13/2015 9:22 AM) General Mental Status-Alert. General Appearance-Consistent with stated age. Hydration-Well hydrated. Voice-Normal.  Head and Neck Head-normocephalic, atraumatic with no lesions or palpable masses. Trachea-midline. Thyroid Gland Characteristics - normal size and consistency.  Chest and Lung Exam Chest and lung exam reveals -quiet, even and easy respiratory effort with no use of accessory muscles and on auscultation, normal breath sounds, no adventitious sounds and normal vocal resonance. Inspection Chest Wall - Normal. Back - normal.  Cardiovascular Cardiovascular examination reveals -normal heart sounds, regular rate and rhythm with no murmurs and normal pedal pulses bilaterally.    Assessment & Plan Christopher Mata(Toshi Ishii MD; 02/13/2015 9:23 AM) HIATAL HERNIA WITH GERD (530.81  K21.9) Impression: 69 year old male with a large hiatal hernia.  1. The patient will like to proceed to the operating room for a hiatal hernia repair with Nissen fundoplication. 2. Discussed with the patient the risks and benefits of the procedure to include but not limited to: Infection, bleeding, damage to surrounding structures specifically the esophagus and long, Possible need for further surgery and/or chest tube placement, and possible recurrence. Patient voiced understanding and wishes to proceed.

## 2015-03-17 NOTE — Interval H&P Note (Signed)
History and Physical Interval Note:  03/17/2015 7:08 AM  Christopher Mata  has presented today for surgery, with the diagnosis of Hiatal Hernia  The various methods of treatment have been discussed with the patient and family. After consideration of risks, benefits and other options for treatment, the patient has consented to  Procedure(s): XI ROBOTIC ASSISTED LAPAROSCOPIC NISSEN FUNDOPLICATION (N/A) as a surgical intervention .  The patient's history has been reviewed, patient examined, no change in status, stable for surgery.  I have reviewed the patient's chart and labs.  Questions were answered to the patient's satisfaction.     Marigene Ehlers., Jed Limerick

## 2015-03-17 NOTE — Op Note (Signed)
03/17/2015  1:20 PM  PATIENT:  Christopher Mata  69 y.o. male  PRE-OPERATIVE DIAGNOSIS:  Hiatal Hernia  POST-OPERATIVE DIAGNOSIS:  Hiatal Hernia  PROCEDURE:  Procedure(s): XI ROBOTIC ASSISTED LAPAROSCOPIC NISSEN FUNDOPLICATION (N/A) LAPAROSCOPIC REPAIR OF HIATAL HERNIA INSERTION OF MESH  SURGEON:  Surgeon(s) and Role:    * Axel Filler, MD - Primary    * Karie Soda, MD - Assisting  ANESTHESIA:   local and general  EBL:50cc  Total I/O In: 3250 [I.V.:3000; IV Piggyback:250] Out: 300 [Urine:200; Blood:100]  BLOOD ADMINISTERED:none  DRAINS: none   LOCAL MEDICATIONS USED:  BUPIVICAINE   SPECIMEN:  No Specimen  DISPOSITION OF SPECIMEN:  N/A  COUNTS:  YES  TOURNIQUET:  * No tourniquets in log *  DICTATION: .Dragon Dictation She was taken back to the operating room and placed in the lithotomy position with bilateral SCDs in place. The patient was prepped and draped in the usual sterile fashion. After appropriate antibiotics were confirmed a timeout was called and all facts were verified.   A Veress needle technique was used to insufflate the abdomen to 15 mm of mercury the paramedian stab incision. Subsequent to this an 8 mm trocar was introduced as was a 8 millimeter camera. At this time the subsequent robotic trochars x3, were then placed adjacent to this trocar approximately 8-10 cm away. Each trocar was used under direct visualization, there were total of 4 trochars. Assistant trocar was then placed in the right lower quadrant under direct visualization. A 12 mm trocar. The Nathanson retractor was then visualized inserted into the abdomen and the incision just to the left of the falciform ligament. This was then placed to retract the liver appropriately. At this time the patient was positioned in reverse Trendelenburg.   At this time the robot patient cart was brought to the bedside and placed in good position and the arms were docked to the trochars  appropriately.  There was a large amount of omentum and stomach herniated into the chest.  We slowly reduced the hernia contents.  This took about 1.5hrs.  A 2nd assistant port was placed in the RLQ to assist with reduction of the hernia contents.  There hernia sac was reduced and used as traction to help reduce the contents.   At this time I proceeded to mobilize the stomach inferiorly and visualize the right crus. The peritoneum over the right crus was incised to the right crus was identified. Proceed dissect this inferiorly until the left crus was seen joining the right crus. Once the right crus was adequately dissected we turned our to the left crus which was dissected away. This required traction of the stomach to the right side. Once this was visualized we then proceeded to circumferentially dissect the esophagus away from the surrounding tissue. At this time a Penrose drain was placed around the esophagus to help with retraction. At this time the phrenoesophageal fat pad was dissected away from the esophagus. The hiatal hernia was very large.  We identified the posterior and anterior vagus nerves and protected these for esophageal moblization. I mobilized the esophagus cephalad approximately 3-4 cm, clearing away the surrounding tissue.   At this time we turned our attention to the greater curvature the stomach and the omentum was mobilized using the robotic vessel sealer. This was taken up to the greater curvature to the hiatus. This mobilized the entire greater curvature to allow mobilization and the wrap. I then proceeded to bring the greater curvature the stomach posterior  to the esophagus, and a shoeshine technique was used to evaluate the mobilization of the greater curvature.   At this time I proceeded to close the hiatus using 4 interrupted 2-0 Ethibond stitches in figure of 8 fashion. This brought together the hiatal closure without undue stricture to the esophagus. There was some splitting  of the right side of the crus.  A piece of BioA mesh was place to reinforce the hernia.  This was anchored anteriorly to the diaphragm with 2-0 silks.  At this time Tisseal was used to secure the mesh to the crual repair.  At this time the greater curvature was brought around the esophagus and sutured using 2-0 silk sutures interrupted fashion approximately 1 cm apart x3. The middle suture was sutured to the esophagus. A left collar stitch was then used to gastropexy the stomach from the wrap to the diaphragm just lateral to the left crus as.  A second collar stitch was placed from the wrap to the right crus. The wrap lay at approximately 11:00 on its own with undue tension.  The wrap lay loose with no strangulation of the esophagus.  At this time the robot was undocked. The total liver trocar was removed and the fascia was reapproximated using an Endo Close device and an 0 Vicryl in interrupted fashion x1. At this time insufflation was evacuated. Skin was reapproximated for Monocryl subcuticular fashion. The skin was then dressed with Dermabond. The patient tolerated the procedure well and was taken to the recovery room in stable condition.    PLAN OF CARE: Admit to inpatient   PATIENT DISPOSITION:  PACU - hemodynamically stable.   Delay start of Pharmacological VTE agent (>24hrs) due to surgical blood loss or risk of bleeding: not applicable

## 2015-03-17 NOTE — Anesthesia Procedure Notes (Signed)
Procedure Name: Intubation Date/Time: 03/17/2015 7:37 AM Performed by: Paulla Dolly A Pre-anesthesia Checklist: Patient identified, Timeout performed, Emergency Drugs available, Suction available and Patient being monitored Patient Re-evaluated:Patient Re-evaluated prior to inductionOxygen Delivery Method: Circle system utilized Preoxygenation: Pre-oxygenation with 100% oxygen Intubation Type: Combination inhalational/ intravenous induction and Cricoid Pressure applied Ventilation: Mask ventilation without difficulty Laryngoscope Size: Mac and 4 Grade View: Grade III Tube type: Oral Tube size: 8.5 mm Number of attempts: 1 Airway Equipment and Method: Stylet Placement Confirmation: ETT inserted through vocal cords under direct vision,  positive ETCO2 and breath sounds checked- equal and bilateral Secured at: 23 cm Tube secured with: Tape Dental Injury: Teeth and Oropharynx as per pre-operative assessment

## 2015-03-18 ENCOUNTER — Encounter (HOSPITAL_COMMUNITY): Payer: Self-pay | Admitting: Surgery

## 2015-03-18 ENCOUNTER — Inpatient Hospital Stay (HOSPITAL_COMMUNITY): Payer: Medicare Other

## 2015-03-18 DIAGNOSIS — I10 Essential (primary) hypertension: Secondary | ICD-10-CM | POA: Diagnosis present

## 2015-03-18 DIAGNOSIS — E663 Overweight: Secondary | ICD-10-CM | POA: Diagnosis present

## 2015-03-18 DIAGNOSIS — J302 Other seasonal allergic rhinitis: Secondary | ICD-10-CM

## 2015-03-18 DIAGNOSIS — IMO0001 Reserved for inherently not codable concepts without codable children: Secondary | ICD-10-CM | POA: Diagnosis present

## 2015-03-18 DIAGNOSIS — K449 Diaphragmatic hernia without obstruction or gangrene: Secondary | ICD-10-CM

## 2015-03-18 DIAGNOSIS — K219 Gastro-esophageal reflux disease without esophagitis: Secondary | ICD-10-CM | POA: Diagnosis present

## 2015-03-18 HISTORY — DX: Diaphragmatic hernia without obstruction or gangrene: K44.9

## 2015-03-18 LAB — CBC
HCT: 37.4 % — ABNORMAL LOW (ref 39.0–52.0)
Hemoglobin: 12.4 g/dL — ABNORMAL LOW (ref 13.0–17.0)
MCH: 30.2 pg (ref 26.0–34.0)
MCHC: 33.2 g/dL (ref 30.0–36.0)
MCV: 91.2 fL (ref 78.0–100.0)
Platelets: 185 10*3/uL (ref 150–400)
RBC: 4.1 MIL/uL — ABNORMAL LOW (ref 4.22–5.81)
RDW: 13.9 % (ref 11.5–15.5)
WBC: 12.8 10*3/uL — ABNORMAL HIGH (ref 4.0–10.5)

## 2015-03-18 LAB — BASIC METABOLIC PANEL
Anion gap: 6 (ref 5–15)
BUN: 19 mg/dL (ref 6–20)
CO2: 26 mmol/L (ref 22–32)
Calcium: 8.5 mg/dL — ABNORMAL LOW (ref 8.9–10.3)
Chloride: 102 mmol/L (ref 101–111)
Creatinine, Ser: 0.97 mg/dL (ref 0.61–1.24)
GFR calc Af Amer: >60 mL/min (ref >60)
GFR calc non Af Amer: >60 mL/min (ref >60)
Glucose, Bld: 176 mg/dL — ABNORMAL HIGH (ref 65–99)
Potassium: 4.3 mmol/L (ref 3.5–5.1)
Sodium: 134 mmol/L — ABNORMAL LOW (ref 135–145)

## 2015-03-18 MED ORDER — BISACODYL 10 MG RE SUPP: 10.0000 mg | Freq: Two times a day (BID) | RECTAL | Status: DC | PRN

## 2015-03-18 MED ORDER — ACETAMINOPHEN 500 MG PO TABS
1000.0000 mg | ORAL_TABLET | Freq: Three times a day (TID) | ORAL | Status: DC
  Administered 2015-03-18 – 2015-03-22 (×13): 1000 mg via ORAL
  Filled 2015-03-18 (×19): qty 2

## 2015-03-18 MED ORDER — DIPHENHYDRAMINE HCL 50 MG/ML IJ SOLN
12.5000 mg | Freq: Four times a day (QID) | INTRAMUSCULAR | Status: DC | PRN
  Administered 2015-03-19: 12.5 mg via INTRAVENOUS
  Filled 2015-03-18: qty 1

## 2015-03-18 MED ORDER — LIP MEDEX EX OINT
1.0000 "application " | TOPICAL_OINTMENT | Freq: Two times a day (BID) | CUTANEOUS | Status: DC
  Administered 2015-03-19 – 2015-03-23 (×6): 1 via TOPICAL
  Filled 2015-03-18: qty 7

## 2015-03-18 MED ORDER — LACTATED RINGERS IV BOLUS (SEPSIS)
1000.0000 mL | Freq: Three times a day (TID) | INTRAVENOUS | Status: AC | PRN
  Administered 2015-03-19: 1000 mL via INTRAVENOUS
  Filled 2015-03-18: qty 1000

## 2015-03-18 MED ORDER — IOHEXOL 300 MG/ML  SOLN
75.0000 mL | Freq: Once | INTRAMUSCULAR | Status: AC | PRN
  Administered 2015-03-18: 75 mL via ORAL

## 2015-03-18 MED ORDER — METOPROLOL TARTRATE 1 MG/ML IV SOLN
5.0000 mg | Freq: Four times a day (QID) | INTRAVENOUS | Status: DC | PRN
  Filled 2015-03-18: qty 5

## 2015-03-18 MED ORDER — SODIUM CHLORIDE 0.9 % IJ SOLN: 3.0000 mL | INTRAMUSCULAR | Status: DC | PRN

## 2015-03-18 MED ORDER — ASPIRIN EC 81 MG PO TBEC
81.0000 mg | DELAYED_RELEASE_TABLET | Freq: Every day | ORAL | Status: DC
  Administered 2015-03-18 – 2015-03-23 (×6): 81 mg via ORAL
  Filled 2015-03-18 (×6): qty 1

## 2015-03-18 MED ORDER — HYDROMORPHONE HCL 1 MG/ML IJ SOLN
0.5000 mg | INTRAMUSCULAR | Status: DC | PRN
  Administered 2015-03-18 – 2015-03-19 (×5): 1 mg via INTRAVENOUS
  Administered 2015-03-20: 2 mg via INTRAVENOUS
  Administered 2015-03-20 – 2015-03-22 (×2): 1 mg via INTRAVENOUS
  Filled 2015-03-18 (×3): qty 1
  Filled 2015-03-18: qty 2
  Filled 2015-03-18 (×4): qty 1

## 2015-03-18 MED ORDER — TAMSULOSIN HCL 0.4 MG PO CAPS
0.4000 mg | ORAL_CAPSULE | Freq: Every day | ORAL | Status: DC
  Administered 2015-03-18: 0.4 mg via ORAL
  Filled 2015-03-18 (×2): qty 1

## 2015-03-18 MED ORDER — FLUTICASONE PROPIONATE 50 MCG/ACT NA SUSP
1.0000 | Freq: Every day | NASAL | Status: DC
  Administered 2015-03-19 – 2015-03-21 (×3): 1 via NASAL
  Filled 2015-03-18: qty 16

## 2015-03-18 MED ORDER — SODIUM CHLORIDE 0.9 % IV SOLN: 250.0000 mL | INTRAVENOUS | Status: DC | PRN

## 2015-03-18 MED ORDER — METHOCARBAMOL 1000 MG/10ML IJ SOLN
1000.0000 mg | Freq: Four times a day (QID) | INTRAVENOUS | Status: DC | PRN
  Filled 2015-03-18: qty 10

## 2015-03-18 MED ORDER — VALSARTAN-HYDROCHLOROTHIAZIDE 80-12.5 MG PO TABS: 1.0000 | ORAL_TABLET | Freq: Every morning | ORAL | Status: DC

## 2015-03-18 MED ORDER — OXYCODONE HCL 5 MG PO TABS: 5.0000 mg | ORAL_TABLET | ORAL | Status: DC | PRN

## 2015-03-18 MED ORDER — PHENOL 1.4 % MT LIQD
2.0000 | OROMUCOSAL | Status: DC | PRN
Start: 2015-03-18 — End: 2015-03-23

## 2015-03-18 MED ORDER — PROMETHAZINE HCL 25 MG/ML IJ SOLN: 6.2500 mg | INTRAMUSCULAR | Status: DC | PRN

## 2015-03-18 MED ORDER — IRBESARTAN 75 MG PO TABS
75.0000 mg | ORAL_TABLET | Freq: Every day | ORAL | Status: DC
  Administered 2015-03-18 – 2015-03-23 (×6): 75 mg via ORAL
  Filled 2015-03-18 (×6): qty 1

## 2015-03-18 MED ORDER — POLYETHYLENE GLYCOL 3350 17 G PO PACK
17.0000 g | PACK | Freq: Two times a day (BID) | ORAL | Status: DC | PRN
  Administered 2015-03-19: 17 g via ORAL
  Filled 2015-03-18: qty 1

## 2015-03-18 MED ORDER — LORATADINE 10 MG PO TABS
10.0000 mg | ORAL_TABLET | Freq: Every day | ORAL | Status: DC
  Administered 2015-03-19 – 2015-03-23 (×5): 10 mg via ORAL
  Filled 2015-03-18 (×6): qty 1

## 2015-03-18 MED ORDER — METOPROLOL TARTRATE 12.5 MG HALF TABLET
12.5000 mg | ORAL_TABLET | Freq: Two times a day (BID) | ORAL | Status: DC | PRN
Start: 2015-03-18 — End: 2015-03-23
  Filled 2015-03-18: qty 1

## 2015-03-18 MED ORDER — AMLODIPINE BESYLATE 5 MG PO TABS
5.0000 mg | ORAL_TABLET | Freq: Every morning | ORAL | Status: DC
  Administered 2015-03-18 – 2015-03-23 (×6): 5 mg via ORAL
  Filled 2015-03-18 (×6): qty 1

## 2015-03-18 MED ORDER — MENTHOL 3 MG MT LOZG: 1.0000 | LOZENGE | OROMUCOSAL | Status: DC | PRN

## 2015-03-18 MED ORDER — PANTOPRAZOLE SODIUM 40 MG PO TBEC
40.0000 mg | DELAYED_RELEASE_TABLET | Freq: Every day | ORAL | Status: DC
  Administered 2015-03-18 – 2015-03-23 (×6): 40 mg via ORAL
  Filled 2015-03-18 (×7): qty 1

## 2015-03-18 MED ORDER — HYDROCHLOROTHIAZIDE 12.5 MG PO CAPS
12.5000 mg | ORAL_CAPSULE | Freq: Every day | ORAL | Status: DC
  Administered 2015-03-18 – 2015-03-23 (×6): 12.5 mg via ORAL
  Filled 2015-03-18 (×6): qty 1

## 2015-03-18 MED ORDER — SODIUM CHLORIDE 0.9 % IJ SOLN
3.0000 mL | Freq: Two times a day (BID) | INTRAMUSCULAR | Status: DC
  Administered 2015-03-21: 3 mL via INTRAVENOUS

## 2015-03-18 MED ORDER — MAGIC MOUTHWASH
15.0000 mL | Freq: Four times a day (QID) | ORAL | Status: DC | PRN
  Filled 2015-03-18: qty 15

## 2015-03-18 MED ORDER — ALUM & MAG HYDROXIDE-SIMETH 200-200-20 MG/5ML PO SUSP: 30.0000 mL | Freq: Four times a day (QID) | ORAL | Status: DC | PRN

## 2015-03-18 NOTE — Progress Notes (Signed)
Pt c/o pressure in lower abd.  He states the catheter isn't draining.  Repositioned catheter; clear yellow urine draining adequately.  Performed bladder scan- 33cc

## 2015-03-18 NOTE — Progress Notes (Signed)
[facility]  [address] [place], [place] [address] Phone: [phone] FAX: [place] [phone] 1945-11-24   Problem List:   Active Problems:   S/P Nissen fundoplication (without gastrostomy tube) procedure   GERD (gastroesophageal reflux disease)   Hypertension   Shortness of breath dyspnea   Obesity (BMI 30-39.9)   1 Day Post-Op  03/17/2015  Procedure(s): XI ROBOTIC ASSISTED LAPAROSCOPIC NISSEN FUNDOPLICATION LAPAROSCOPIC REPAIR OF HIATAL HERNIA INSERTION OF MESH  Assessment  OK  Plan:  -try liquids - Bariatric 1 limited volume at first to go slowly w bloated state -HTN control -PPI -VTE prophylaxis- SCDs, etc -mobilize as tolerated to help recovery  [name], M.D., F.A.C.S. Gastrointestinal and Minimally Invasive Surgery [facility] [address], [place], [place] [address] [phone] Main / Paging   03/18/2015  Subjective:  Sore Bloated Thirsty/hungry Daughter at bedside Up in chair  Objective:  Vital signs:  Filed Vitals:   03/17/15 1740 03/17/15 2130 03/18/15 0132 03/18/15 0540  BP: 137/82 147/95 133/87 122/79  Pulse: 82 85 87 73  Temp: 97.7 F (36.5 C) 97.9 F (36.6 C) 97.9 F (36.6 C) 98.2 F (36.8 C)  TempSrc: Oral Oral Oral Oral  Resp: _0 Height:      Weight:      SpO2: 96% 95% 92% 94%    Last BM Date: 03/17/15  Intake/Output   Yesterday:  06/10 0701 - 06/11 0700 In: 5428.3 [I.V.:5178.3; IV Piggyback:250] Out: 7741 [Urine:1275; Blood:100] This shift:     Bowel function:  Flatus: n  BM: n  Drain: n/a  Physical Exam:  General: Pt awake/alert/oriented x4 in no acute distress Eyes: PERRL, normal EOM.  Sclera clear.  No icterus Neuro: CN II-XII intact w/o focal sensory/motor deficits. Lymph: No head/neck/groin lymphadenopathy Psych:  No delerium/psychosis/paranoia HENT: Normocephalic, Mucus  membranes moist.  No thrush Neck: Supple, No tracheal deviation Chest: No chest wall pain w good excursion CV:  Pulses intact.  Regular rhythm MS: Normal AROM mjr joints.  No obvious deformity Abdomen: Somewhat firm.  Mod distended.  Mildly tender at incisions only.  No evidence of peritonitis.  No incarcerated hernias. Ext:  SCDs BLE.  No mjr edema.  No cyanosis Skin: No petechiae / purpura  Results:   Labs: Results for orders placed or performed during the hospital encounter of 03/17/15 (from the past 48 hour(s))  Basic metabolic panel     Status: Abnormal   Collection Time: 03/18/15  5:24 AM  Result Value Ref Range   Sodium 134 (L) 135 - 145 mmol/L   Potassium 4.3 3.5 - 5.1 mmol/L   Chloride 102 101 - 111 mmol/L   CO2 26 22 - 32 mmol/L   Glucose, Bld 176 (H) 65 - 99 mg/dL   BUN 19 6 - 20 mg/dL   Creatinine, Ser 0.97 0.61 - 1.24 mg/dL   Calcium 8.5 (L) 8.9 - 10.3 mg/dL   GFR calc non Af Amer >60 >60 mL/min   GFR calc Af Amer >60 >60 mL/min    Comment: (NOTE) The eGFR has been calculated using the CKD EPI equation. This calculation has not been validated in all clinical situations. eGFR's persistently <60 mL/min signify possible Chronic Kidney Disease.    Anion gap 6 5 - 15  CBC     Status: Abnormal   Collection Time: 03/18/15  5:24 AM  Result Value Ref Range   WBC  12.8 (H) 4.0 - 10.5 K/uL   RBC 4.10 (L) 4.22 - 5.81 MIL/uL   Hemoglobin 12.4 (L) 13.0 - 17.0 g/dL   HCT 37.4 (L) 39.0 - 52.0 %   MCV 91.2 78.0 - 100.0 fL   MCH 30.2 26.0 - 34.0 pg   MCHC 33.2 30.0 - 36.0 g/dL   RDW 13.9 11.5 - 15.5 %   Platelets 185 150 - 400 K/uL    Imaging / Studies: Dg Chest Port 1 View  03/17/2015   CLINICAL DATA:  Weakness  EXAM: PORTABLE CHEST - 1 VIEW  COMPARISON:  12/20/2014  FINDINGS: Cardiac shadow is within normal limits. There is been interval surgical repair the patient's hiatal hernia. Patchy infiltrative changes are noted within both lungs which may represent some acute  infiltrate. Continued followup is recommended. No sizable effusion is seen. Loose bodies are noted in the right shoulder joint.  IMPRESSION: Patchy changes in both lungs which may represent focal infiltrate. Followup imaging is recommended.   Electronically Signed   By: Inez Catalina M.D.   On: 03/17/2015 15:30    Medications / Allergies: per chart  Antibiotics: Anti-infectives    Start     Dose/Rate Route Frequency Ordered Stop   03/17/15 1130  ceFAZolin (ANCEF) IVPB 2 g/50 mL premix     2 g 100 mL/hr over 30 Minutes Intravenous  Once 03/17/15 1125 03/17/15 1130   03/17/15 0515  ceFAZolin (ANCEF) IVPB 2 g/50 mL premix     2 g 100 mL/hr over 30 Minutes Intravenous On call to O.R. 03/17/15 0515 03/17/15 0724        Note: Portions of this report may have been transcribed using voice recognition software. Every effort was made to ensure accuracy; however, inadvertent computerized transcription errors may be present.   Any transcriptional errors that result from this process are unintentional.     [name], M.D., F.A.C.S. Gastrointestinal and Minimally Invasive Surgery [facility] [address] [place], [place] [address] [phone] Main / Paging   03/18/2015  CARE TEAM:  PCP: Delia Chimes, NP  Outpatient Care Team: Patient Care Team: Delia Chimes, NP as PCP - General (Nurse Practitioner)  Inpatient Treatment Team: Treatment Team: Attending Provider: Ralene Ok, MD; Technician: Leda Quail, NT

## 2015-03-18 NOTE — Progress Notes (Signed)
Dr. Donell Beers aware via phone pt unable to void since caude cath dc'd earlier today. Recent bladder scan resulted 257 ml urine.Pt taking po clears well. Ambulating. No discomfort or urge to go. See new orders received.

## 2015-03-19 MED ORDER — TAMSULOSIN HCL 0.4 MG PO CAPS
0.8000 mg | ORAL_CAPSULE | Freq: Every day | ORAL | Status: DC
  Administered 2015-03-19 – 2015-03-23 (×5): 0.8 mg via ORAL
  Filled 2015-03-19 (×5): qty 2

## 2015-03-19 MED ORDER — SIMETHICONE 80 MG PO CHEW
80.0000 mg | CHEWABLE_TABLET | Freq: Four times a day (QID) | ORAL | Status: AC
  Administered 2015-03-19 – 2015-03-21 (×9): 80 mg via ORAL
  Filled 2015-03-19 (×12): qty 1

## 2015-03-19 MED ORDER — BISACODYL 10 MG RE SUPP
10.0000 mg | Freq: Every day | RECTAL | Status: DC
  Administered 2015-03-19 – 2015-03-22 (×3): 10 mg via RECTAL
  Filled 2015-03-19 (×3): qty 1

## 2015-03-19 MED ORDER — LACTATED RINGERS IV BOLUS (SEPSIS)
1000.0000 mL | Freq: Once | INTRAVENOUS | Status: AC
  Administered 2015-03-19: 1000 mL via INTRAVENOUS

## 2015-03-19 NOTE — Progress Notes (Signed)
Coude 44F inserted by 4th floor CN. 475ccs of urine returned. Pt states he feels the pressure easing off.    Christopher Mata

## 2015-03-19 NOTE — Progress Notes (Signed)
Pt still unable to void. Bladder scanner at 2030 only showed 84ccs. Bladder scanner at 2315 showed 101ccs.  Bladder scanner does seem to be reading incorrectly as it will show a large yellow area on the scanner, then shrink down to a low number. Pt did attempt to void, got 3 drops out that were tinged with blood.  Pt stated those drops 'burned like fire' when he was able to get those drops out. Called MD on call. MD ordered coude to be replaced.  Will obtain coude and call 4th floor charge RN.  Sherron Monday

## 2015-03-19 NOTE — Progress Notes (Signed)
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  Rec to pt to consider outpt Urology evaluation -VTE prophylaxis- SCDs, etc -mobilize as tolerated to help recovery  Christopher Mata, M.D., F.A.C.S. Gastrointestinal and Minimally Invasive Surgery [facility] [address], [place], [place] [address] [phone] Main / Paging   03/19/2015  Subjective:  Difficulty urinating - Foley replaced - takes OTC prostate med & flomax.  Never seen a Urologist for nocturia/frequency Sore Feels more bloated Less flatus Occ burping Up in chair  Objective:  Vital signs:  Filed Vitals:   03/18/15 1218 03/18/15 1422 03/18/15 2132 03/19/15 0541  BP: 120/74 122/86 118/81 124/84  Pulse:  85 100 110  Temp:  98.6 F (37 C) 99.6 F (37.6 C) 98.3 F (36.8 C)  TempSrc:  Oral Oral Oral  Resp:  18 18 18   Height:      Weight:      SpO2:  91% 92% 91%    Last BM Date: 03/17/15  Intake/Output   Yesterday:  06/11 0701 - 06/12 0700 In: 1984.2 [P.O.:480; I.V.:1504.2] Out: 780 [Urine:780] This shift:     Bowel function:  Flatus: y  BM: n  Drain: n/a  Physical Exam:  General: Pt awake/alert/oriented  x4 in no acute distress Eyes: PERRL, normal EOM.  Sclera clear.  No icterus Neuro: CN II-XII intact w/o focal sensory/motor deficits. Lymph: No head/neck/groin lymphadenopathy Psych:  No delerium/psychosis/paranoia HENT: Normocephalic, Mucus membranes moist.  No thrush Neck: Supple, No tracheal deviation Chest: No chest wall pain w good excursion CV:  Pulses intact.  Regular rhythm MS: Normal AROM mjr joints.  No obvious deformity Abdomen: Somewhat firm.  Very distended.  Mildly tender at incisions only.  No guarding/RT.  No evidence of peritonitis.  No incarcerated hernias. Ext:  SCDs BLE.  No mjr edema.  No cyanosis Skin: No petechiae / purpura  Results:   Labs: Results for orders placed or performed during the hospital encounter of 03/17/15 (from the past 48 hour(s))  Basic metabolic panel     Status: Abnormal   Collection Time: 03/18/15  5:24 AM  Result Value Ref Range   Sodium 134 (L) 135 - 145 mmol/L   Potassium 4.3 3.5 - 5.1 mmol/L   Chloride 102 101 - 111 mmol/L   CO2 26 22 - 32 mmol/L   Glucose, Bld 176 (H) 65 - 99 mg/dL   BUN 19 6 - 20 mg/dL   Creatinine, Ser 0.97 0.61 - 1.24 mg/dL   Calcium 8.5 (L) 8.9 - 10.3 mg/dL   GFR calc non Af Amer >60 >60 mL/min   GFR calc Af Amer >60 >60 mL/min    Comment: (NOTE) The eGFR has been calculated using the CKD EPI equation. This calculation has not  been validated in all clinical situations. eGFR's persistently <60 mL/min signify possible Chronic Kidney Disease.    Anion gap 6 5 - 15  CBC     Status: Abnormal   Collection Time: 03/18/15  5:24 AM  Result Value Ref Range   WBC 12.8 (H) 4.0 - 10.5 K/uL   RBC 4.10 (L) 4.22 - 5.81 MIL/uL   Hemoglobin 12.4 (L) 13.0 - 17.0 g/dL   HCT 37.4 (L) 39.0 - 52.0 %   MCV 91.2 78.0 - 100.0 fL   MCH 30.2 26.0 - 34.0 pg   MCHC 33.2 30.0 - 36.0 g/dL   RDW 13.9 11.5 - 15.5 %   Platelets 185 150 - 400 K/uL    Imaging / Studies: Dg Chest Port 1 View  03/17/2015   CLINICAL DATA:  Weakness   EXAM: PORTABLE CHEST - 1 VIEW  COMPARISON:  12/20/2014  FINDINGS: Cardiac shadow is within normal limits. There is been interval surgical repair the patient's hiatal hernia. Patchy infiltrative changes are noted within both lungs which may represent some acute infiltrate. Continued followup is recommended. No sizable effusion is seen. Loose bodies are noted in the right shoulder joint.  IMPRESSION: Patchy changes in both lungs which may represent focal infiltrate. Followup imaging is recommended.   Electronically Signed   By: Inez Catalina M.D.   On: 03/17/2015 15:30   Dg Esophagus W/water Sol Cm  03/18/2015   CLINICAL DATA:  69 year old male, postop day 1, status post Nissen fundoplication.  EXAM: ESOPHOGRAM  TECHNIQUE: Single contrast examination was performed using water-soluble contrast.  FLUOROSCOPY TIME:  Fluoroscopy Time:  1 minutes, 33 seconds  Number of Acquired Images:  16  COMPARISON:  Plain film 03/17/2015, chest CT 09/13/2008  FINDINGS: Scout image demonstrates stomach air below the level of the diaphragm.  There is a small amount of free air in the abdomen subjacent to the left hemidiaphragm.  Observation of ingestion of water-soluble contrast demonstrates nonspecific dysmotility, with slight delay in transit of contrast through the distal esophagus and the gastroesophageal junction. By the completion of the study, the majority of contrast had traversed the fundoplication, with small amount of residual dilute contrast within the distal esophagus.  The fundoplication is located below the level of the diaphragm.  No extra anatomic contrast identified.  IMPRESSION: Slightly delayed transit of contrast across the fundoplication, although the fundoplication is patent and below the level of the diaphragm.  No evidence of leak.  Small amount of free air under the left hemidiaphragm, presumably postoperative in this postop day 1 patient.  Signed,  Dulcy Fanny. Earleen Newport, DO  [facility]  [facility]   Electronically Signed   By: Corrie Mckusick D.O.   On: 03/18/2015 12:16    Medications / Allergies: per chart  Antibiotics: Anti-infectives    Start     Dose/Rate Route Frequency Ordered Stop   03/17/15 1130  ceFAZolin (ANCEF) IVPB 2 g/50 mL premix     2 g 100 mL/hr over 30 Minutes Intravenous  Once 03/17/15 1125 03/17/15 1130   03/17/15 0515  ceFAZolin (ANCEF) IVPB 2 g/50 mL premix     2 g 100 mL/hr over 30 Minutes Intravenous On call to O.R. 03/17/15 0515 03/17/15 0724        Note: Portions of this report may have been transcribed using voice recognition software. Every effort was made to ensure accuracy; however, inadvertent computerized transcription errors may be present.   Any transcriptional errors that result  from this process are unintentional.     Christopher Mata, M.D., F.A.C.S. Gastrointestinal and Minimally Invasive Surgery [facility] [address] [place], [place] [address] [phone] Main / Paging   03/19/2015  CARE TEAM:  PCP: Delia Chimes, NP  Outpatient Care Team: Patient Care Team: Delia Chimes, NP as PCP - General (Nurse Practitioner)  Inpatient Treatment Team: Treatment Team: Attending Provider: Ralene Ok, MD; Technician: Leda Quail, NT

## 2015-03-19 NOTE — Plan of Care (Signed)
Problem: Food- and Nutrition-Related Knowledge Deficit (NB-1.1) Goal: Nutrition education Formal process to instruct or train a patient/client in a skill or to impart knowledge to help patients/clients voluntarily manage or modify food choices and eating behavior to maintain or improve health. Outcome: Completed/Met Date Met:  03/19/15  RD consulted for education regarding pt s/p nissen fundoplication. Pt to follow clear liquid diet x 2 weeks.  RD provided "Nissen Fundoplication" and "Protein supplement option" handouts. Provided examples of clear liquids for patient to consume for 2 weeks. Provided list of clear liquid protein options. Discussed eating small frequent meals.Teach back method used.  Expect good compliance with support from wife.  Body mass index is 31.81 kg/(m^2). Pt meets criteria for obesity based on current BMI.  Current diet order is bariatric clear liquids. Labs and medications reviewed. No further nutrition interventions warranted at this time.  If additional nutrition issues arise, please re-consult RD.   Clayton Bibles, MS, RD, LDN Pager: [phone] After Hours Pager: [phone]

## 2015-03-20 ENCOUNTER — Encounter (HOSPITAL_COMMUNITY): Payer: Self-pay | Admitting: General Surgery

## 2015-03-20 LAB — CBC
HCT: 31.6 % — ABNORMAL LOW (ref 39.0–52.0)
Hemoglobin: 10.6 g/dL — ABNORMAL LOW (ref 13.0–17.0)
MCH: 30.7 pg (ref 26.0–34.0)
MCHC: 33.5 g/dL (ref 30.0–36.0)
MCV: 91.6 fL (ref 78.0–100.0)
Platelets: 170 10*3/uL (ref 150–400)
RBC: 3.45 MIL/uL — ABNORMAL LOW (ref 4.22–5.81)
RDW: 13.9 % (ref 11.5–15.5)
WBC: 6.2 10*3/uL (ref 4.0–10.5)

## 2015-03-20 LAB — BASIC METABOLIC PANEL
Anion gap: 8 (ref 5–15)
BUN: 20 mg/dL (ref 6–20)
CO2: 26 mmol/L (ref 22–32)
Calcium: 8.1 mg/dL — ABNORMAL LOW (ref 8.9–10.3)
Chloride: 98 mmol/L — ABNORMAL LOW (ref 101–111)
Creatinine, Ser: 1.06 mg/dL (ref 0.61–1.24)
GFR calc Af Amer: >60 mL/min (ref >60)
GFR calc non Af Amer: >60 mL/min (ref >60)
Glucose, Bld: 135 mg/dL — ABNORMAL HIGH (ref 65–99)
Potassium: 3.8 mmol/L (ref 3.5–5.1)
Sodium: 132 mmol/L — ABNORMAL LOW (ref 135–145)

## 2015-03-20 MED ORDER — ENSURE ENLIVE PO LIQD
237.0000 mL | Freq: Two times a day (BID) | ORAL | Status: DC
  Administered 2015-03-20 – 2015-03-22 (×3): 237 mL via ORAL

## 2015-03-20 MED ORDER — HYDROCODONE-ACETAMINOPHEN 7.5-325 MG/15ML PO SOLN
10.0000 mL | ORAL | Status: DC | PRN
  Administered 2015-03-20 – 2015-03-23 (×8): 10 mL via ORAL
  Filled 2015-03-20 (×8): qty 15

## 2015-03-20 NOTE — Progress Notes (Signed)
3 Days Post-Op  Subjective: Pt feeling much better. Passing lots of gas and had some BMs mobilizing  Objective: Vital signs in last 24 hours: Temp:  [98.4 F (36.9 C)-98.7 F (37.1 C)] 98.4 F (36.9 C) (06/13 0639) Pulse Rate:  [95-98] 98 (06/13 0639) Resp:  [18] 18 (06/13 0639) BP: (92-127)/(63-79) 92/63 mmHg (06/13 0639) SpO2:  [97 %-98 %] 97 % (06/13 0639) Last BM Date: 03/20/15  Intake/Output from previous day: 06/12 0701 - 06/13 0700 In: 2040 [P.O.:840; I.V.:1200] Out: 1225 [Urine:1225] Intake/Output this shift: Total I/O In: -  Out: 200 [Urine:200]  General appearance: alert and cooperative GI: soft, non-tender; bowel sounds normal; no masses,  no organomegaly and wound c/d/i  Lab Results:   Recent Labs  03/18/15 0524 03/20/15 0521  WBC 12.8* 6.2  HGB 12.4* 10.6*  HCT 37.4* 31.6*  PLT 185 170   BMET  Recent Labs  03/18/15 0524 03/20/15 0521  NA 134* 132*  K 4.3 3.8  CL 102 98*  CO2 26 26  GLUCOSE 176* 135*  BUN 19 20  CREATININE 0.97 1.06  CALCIUM 8.5* 8.1*    Anti-infectives: Anti-infectives    Start     Dose/Rate Route Frequency Ordered Stop   03/17/15 1130  ceFAZolin (ANCEF) IVPB 2 g/50 mL premix     2 g 100 mL/hr over 30 Minutes Intravenous  Once 03/17/15 1125 03/17/15 1130   03/17/15 0515  ceFAZolin (ANCEF) IVPB 2 g/50 mL premix     2 g 100 mL/hr over 30 Minutes Intravenous On call to O.R. 03/17/15 0515 03/17/15 0724      Assessment/Plan: s/p Procedure(s): XI ROBOTIC ASSISTED LAPAROSCOPIC NISSEN FUNDOPLICATION (N/A) LAPAROSCOPIC REPAIR OF HIATAL HERNIA INSERTION OF MESH d/c foley  Plan on DC in AM Ensure in between meals mobilize  LOS: 3 days    Marigene Ehlers., [facility] 03/20/2015

## 2015-03-20 NOTE — Care Management Note (Signed)
Case Management Note  Patient Details  Name: Christopher Mata MRN: [id number] Date of Birth: November 01, 1945  Subjective/Objective:        S/p Paraesophageal hiatal hernia s/p robotic repair/fundoplication 03/18/2015            Action/Plan: Discharge planning  Expected Discharge Date:                  Expected Discharge Plan:  Home/Self Care  In-House Referral:  NA  Discharge planning Services  CM Consult  Post Acute Care Choice:    Choice offered to:     DME Arranged:    DME Agency:     HH Arranged:    HH Agency:     Status of Service:  Completed, signed off  Medicare Important Message Given:  Yes Date Medicare IM Given:  03/20/15 Medicare IM give by:  Lorenda Ishihara RN CM  Date Additional Medicare IM Given:    Additional Medicare Important Message give by:     If discussed at Long Length of Stay Meetings, dates discussed:    Additional Comments:  Alexis Goodell, RN 03/20/2015, 9:45 AM

## 2015-03-21 ENCOUNTER — Inpatient Hospital Stay (HOSPITAL_COMMUNITY): Payer: Medicare Other

## 2015-03-21 MED ORDER — FUROSEMIDE 10 MG/ML IJ SOLN
20.0000 mg | Freq: Once | INTRAMUSCULAR | Status: AC
  Administered 2015-03-21: 20 mg via INTRAVENOUS
  Filled 2015-03-21: qty 2

## 2015-03-21 MED ORDER — IPRATROPIUM-ALBUTEROL 0.5-2.5 (3) MG/3ML IN SOLN
3.0000 mL | Freq: Four times a day (QID) | RESPIRATORY_TRACT | Status: DC | PRN
  Administered 2015-03-22: 3 mL via RESPIRATORY_TRACT
  Filled 2015-03-21 (×2): qty 3

## 2015-03-21 MED ORDER — IPRATROPIUM-ALBUTEROL 0.5-2.5 (3) MG/3ML IN SOLN
3.0000 mL | RESPIRATORY_TRACT | Status: DC
  Administered 2015-03-21: 3 mL via RESPIRATORY_TRACT
  Filled 2015-03-21: qty 3

## 2015-03-21 NOTE — Progress Notes (Signed)
4 Days Post-Op  Subjective: Pt with SOB this AM and with ambulation Tol PO  Objective: Vital signs in last 24 hours: Temp:  [98.4 F (36.9 C)-99 F (37.2 C)] 98.8 F (37.1 C) (06/14 0509) Pulse Rate:  [107-115] 107 (06/14 0509) Resp:  [18] 18 (06/14 0509) BP: (101-154)/(67-78) 154/78 mmHg (06/14 0509) SpO2:  [93 %-98 %] 98 % (06/14 0509) Last BM Date: 03/20/15  Intake/Output from previous day: 06/13 0701 - 06/14 0700 In: 760 [P.O.:360; I.V.:400] Out: 350 [Urine:350] Intake/Output this shift:    General appearance: alert and cooperative Resp: clear to auscultation bilaterally GI: soft, wound c/d/i, abd distention  Lab Results:   Recent Labs  03/20/15 0521  WBC 6.2  HGB 10.6*  HCT 31.6*  PLT 170   BMET  Recent Labs  03/20/15 0521  NA 132*  K 3.8  CL 98*  CO2 26  GLUCOSE 135*  BUN 20  CREATININE 1.06  CALCIUM 8.1*   Anti-infectives: Anti-infectives    Start     Dose/Rate Route Frequency Ordered Stop   03/17/15 1130  ceFAZolin (ANCEF) IVPB 2 g/50 mL premix     2 g 100 mL/hr over 30 Minutes Intravenous  Once 03/17/15 1125 03/17/15 1130   03/17/15 0515  ceFAZolin (ANCEF) IVPB 2 g/50 mL premix     2 g 100 mL/hr over 30 Minutes Intravenous On call to O.R. 03/17/15 0515 03/17/15 0724      Assessment/Plan: s/p Procedure(s): XI ROBOTIC ASSISTED LAPAROSCOPIC NISSEN FUNDOPLICATION (N/A) LAPAROSCOPIC REPAIR OF HIATAL HERNIA INSERTION OF MESH Hold DC Lasix CXR/KUB, pt may have extrapleural fluid collection secondary to hiatal hernia repair. O2 suppl, Duoneb   LOS: 4 days    Marigene Ehlers., [facility] 03/21/2015

## 2015-03-22 ENCOUNTER — Inpatient Hospital Stay (HOSPITAL_COMMUNITY): Payer: Medicare Other

## 2015-03-22 ENCOUNTER — Encounter (HOSPITAL_COMMUNITY): Payer: Self-pay | Admitting: Radiology

## 2015-03-22 MED ORDER — DIAZEPAM 5 MG PO TABS: 5.0000 mg | ORAL_TABLET | Freq: Four times a day (QID) | ORAL | Status: DC | PRN

## 2015-03-22 MED ORDER — IOHEXOL 300 MG/ML  SOLN
100.0000 mL | Freq: Once | INTRAMUSCULAR | Status: AC | PRN
  Administered 2015-03-22: 80 mL via INTRAVENOUS

## 2015-03-22 MED ORDER — IPRATROPIUM-ALBUTEROL 0.5-2.5 (3) MG/3ML IN SOLN
3.0000 mL | Freq: Four times a day (QID) | RESPIRATORY_TRACT | Status: DC
  Administered 2015-03-22 (×2): 3 mL via RESPIRATORY_TRACT
  Filled 2015-03-22 (×3): qty 3

## 2015-03-22 NOTE — Progress Notes (Signed)
5 Days Post-Op  Subjective: Pt with some cont SOB Mobilizing Diurising     Objective: Vital signs in last 24 hours: Temp:  [98.2 F (36.8 C)-99.1 F (37.3 C)] 99.1 F (37.3 C) (06/15 0504) Pulse Rate:  [95-112] 95 (06/15 0504) Resp:  [16-20] 16 (06/15 0504) BP: (99-146)/(73-81) 146/81 mmHg (06/15 0504) SpO2:  [94 %-98 %] 97 % (06/15 0504) Last BM Date: 03/21/15  Intake/Output from previous day: 06/14 0701 - 06/15 0700 In: 2660 [P.O.:660; I.V.:2000] Out: -  Intake/Output this shift: Total I/O In: 240 [P.O.:240] Out: -   General appearance: alert and cooperative GI: soft approp ttp, nd, active BS, wound c/d/i  Lab Results:   Recent Labs  03/20/15 0521  WBC 6.2  HGB 10.6*  HCT 31.6*  PLT 170   BMET  Recent Labs  03/20/15 0521  NA 132*  K 3.8  CL 98*  CO2 26  GLUCOSE 135*  BUN 20  CREATININE 1.06  CALCIUM 8.1*   PT/INR No results for input(s): LABPROT, INR in the last 72 hours. ABG No results for input(s): PHART, HCO3 in the last 72 hours.  Invalid input(s): PCO2, PO2  Studies/Results: Dg Chest Port 1 View  03/22/2015   CLINICAL DATA:  Shortness of breath.  EXAM: PORTABLE CHEST - 1 VIEW  COMPARISON:  03/21/2015.  12/20/2014.  CT 09/13/2008.  FINDINGS: Mediastinum and hilar structures are stable. Stable cardiomegaly with normal pulmonary vascularity. Persistent atelectasis and or infiltrate left lung base. Persistent large hiatal hernia. At no pneumothorax.  IMPRESSION: 1. Persistent left lung base atelectasis and/or infiltrate. 2. Persistent large hiatal hernia. 3. Cardiomegaly, no pulmonary venous congestion.   Electronically Signed   By: Maisie Fus  Register   On: 03/22/2015 07:18   Dg Chest Port 1 View  03/21/2015   CLINICAL DATA:  Shortness of breath. Abdominal distention. Hiatal hernia repair on 03/17/2015.  EXAM: PORTABLE CHEST - 1 VIEW  COMPARISON:  Chest x-rays dated 03/17/2015 and 12/20/2014  FINDINGS: There is atelectasis and small effusion at the  left lung base. Heart size and pulmonary vascularity are normal. There has been almost complete clearing of the patchy density in the right midzone with no residual nodularity.  IMPRESSION: Increased atelectasis/ effusion at the left lung base. Almost complete clearing of the right base.   Electronically Signed   By: Francene Boyers M.D.   On: 03/21/2015 08:26   Dg Abd Portable 1v  03/21/2015   CLINICAL DATA:  69 year old male with abdominal distention and pain following hiatal hernia repair last week. Initial encounter.  EXAM: PORTABLE ABDOMEN - 1 VIEW  COMPARISON:  Postoperative esophagram 03/18/2015. Preoperative upper GI series 4416.  FINDINGS: Supine portable views of the abdomen and pelvis. Contrast now throughout the colon. Severe diverticulosis from the splenic flexure to the rectum. Mild to moderate gaseous distention in the epigastrium. The level of the diaphragm is not included. No dilated small or large bowel loops. Stable visualized osseous structures.  IMPRESSION: Normal bowel gas pattern. Residual contrast in the colon. Severe diverticulosis of the distal colon.   Electronically Signed   By: Odessa Fleming M.D.   On: 03/21/2015 08:25    Anti-infectives: Anti-infectives    Start     Dose/Rate Route Frequency Ordered Stop   03/17/15 1130  ceFAZolin (ANCEF) IVPB 2 g/50 mL premix     2 g 100 mL/hr over 30 Minutes Intravenous  Once 03/17/15 1125 03/17/15 1130   03/17/15 0515  ceFAZolin (ANCEF) IVPB 2 g/50 mL premix  2 g 100 mL/hr over 30 Minutes Intravenous On call to O.R. 03/17/15 0515 03/17/15 0724      Assessment/Plan: s/p Procedure(s): XI ROBOTIC ASSISTED LAPAROSCOPIC NISSEN FUNDOPLICATION (N/A) LAPAROSCOPIC REPAIR OF HIATAL HERNIA INSERTION OF MESH Spoke to Dr. Register in regards to CXR read.  He does state it could possibly be a post op fluid collection, and rec CT with contrast to further eval. Schedule breathing treatments as pt states that helped him yesterday with  SOB. Mobilize    LOS: 5 days    Marigene Ehlers., [facility] 03/22/2015

## 2015-03-22 NOTE — Plan of Care (Signed)
Problem: Discharge Progression Outcomes Goal: Barriers To Progression Addressed/Resolved Outcome: Progressing S/p thoracentesis.  Pt reporting improved ability to breathe.

## 2015-03-22 NOTE — Progress Notes (Signed)
Pt refusing SCD's and to allow measuring of urinary output.  Pt educated and acknowledged MD's orders, but said he still would not comply.

## 2015-03-23 ENCOUNTER — Inpatient Hospital Stay (HOSPITAL_COMMUNITY): Payer: Medicare Other

## 2015-03-23 LAB — BASIC METABOLIC PANEL
Anion gap: 7 (ref 5–15)
BUN: 8 mg/dL (ref 6–20)
CO2: 27 mmol/L (ref 22–32)
Calcium: 7.9 mg/dL — ABNORMAL LOW (ref 8.9–10.3)
Chloride: 96 mmol/L — ABNORMAL LOW (ref 101–111)
Creatinine, Ser: 1.09 mg/dL (ref 0.61–1.24)
GFR calc Af Amer: >60 mL/min (ref >60)
GFR calc non Af Amer: >60 mL/min (ref >60)
Glucose, Bld: 126 mg/dL — ABNORMAL HIGH (ref 65–99)
Potassium: 3.2 mmol/L — ABNORMAL LOW (ref 3.5–5.1)
Sodium: 130 mmol/L — ABNORMAL LOW (ref 135–145)

## 2015-03-23 LAB — CBC
HCT: 31.9 % — ABNORMAL LOW (ref 39.0–52.0)
Hemoglobin: 10.4 g/dL — ABNORMAL LOW (ref 13.0–17.0)
MCH: 29.5 pg (ref 26.0–34.0)
MCHC: 32.6 g/dL (ref 30.0–36.0)
MCV: 90.4 fL (ref 78.0–100.0)
Platelets: 219 10*3/uL (ref 150–400)
RBC: 3.53 MIL/uL — ABNORMAL LOW (ref 4.22–5.81)
RDW: 14.2 % (ref 11.5–15.5)
WBC: 14.7 10*3/uL — ABNORMAL HIGH (ref 4.0–10.5)

## 2015-03-23 MED ORDER — POTASSIUM CHLORIDE CRYS ER 20 MEQ PO TBCR
40.0000 meq | EXTENDED_RELEASE_TABLET | Freq: Two times a day (BID) | ORAL | Status: DC
  Administered 2015-03-23: 40 meq via ORAL
  Filled 2015-03-23: qty 2

## 2015-03-23 MED ORDER — POTASSIUM CHLORIDE 20 MEQ PO PACK
40.0000 meq | PACK | Freq: Two times a day (BID) | ORAL | Status: DC
  Filled 2015-03-23 (×2): qty 2

## 2015-03-23 NOTE — Progress Notes (Signed)
Pt vitals stable with no complains of pain or shortness of breath. Discussed discharge instructions with both patient and wife. Both understood and had no question or concerns. Pt discharged to home.

## 2015-03-23 NOTE — Care Management Note (Signed)
Case Management Note  Patient Details  Name: Christopher Mata MRN: [id number] Date of Birth: 1946-03-10   Expected Discharge Date:                  Expected Discharge Plan:  Home/Self Care  In-House Referral:  NA  Discharge planning Services  CM Consult  Post Acute Care Choice:    Choice offered to:     DME Arranged:    DME Agency:     HH Arranged:    HH Agency:     Status of Service:  Completed, signed off  Medicare Important Message Given:  Yes Date Medicare IM Given:  03/20/15 Medicare IM give by:  Lorenda Ishihara RN CM  Date Additional Medicare IM Given:  03/23/15 Additional Medicare Important Message give by:  Lorenda Ishihara RN CM   If discussed at Long Length of Stay Meetings, dates discussed:    Additional Comments:  Alexis Goodell, RN 03/23/2015, 11:12 AM

## 2015-03-23 NOTE — Discharge Summary (Signed)
Physician Discharge Summary  Patient ID: Christopher Mata MRN: [id number] DOB/AGE: [birth year] 69 y.o.  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 Patient underwent esophagogram on postop day #1. This revealed no oblique. Patient was started clear liquid diet. Dietitian services were consulted and patient was instructed on liquid diet. Patient was mobilizing well on his own. Patient good pain control.  Patient did develop some shortness of breath with mobilization. Chest x-rays revealed some fluid within the left chest as well as some atelectasis, as well as some fluid within the old hiatal hernia area. A CT scan was ordered which again revealed the same findings. Thoracentesis was ordered secondary to the patient's shortness of breath. Postprocedure the patient had good outcome and was able to breathe easier.  He had good pain control. He is otherwise afebrile. White count 14.7 which was likely related to procedure and fluid to the left chest. Patient did not appear toxic.  Patient was deemed stable for discharge and discharged home with close follow-up.  Consults: IR  Significant Diagnostic Studies: CT scan which revealed fluid within the left chest as well as the fluid accumulation in the old hiatal hernia area.  Treatments: surgery: As above, thoracentesis on 6/15 per IR  Discharge Exam: Blood pressure 114/73, pulse 94, temperature 97.8 F (36.6 C), temperature source Oral, resp. rate 20, height 5\' 11"  (1.803 m), weight 103.42 kg (228  lb), SpO2 96 %. General appearance: alert and cooperative GI: soft, non-tender; bowel sounds normal; no masses,  no organomegaly  Disposition: 01-Home or Self Care  Discharge Instructions    Diet - low sodium heart healthy    Complete by:  As directed      Increase activity slowly    Complete by:  As directed             Medication List    TAKE these medications        amLODipine 5 MG tablet  Commonly known as:  NORVASC  Take 5 mg by mouth every morning.     aspirin EC 81 MG tablet  Take 81 mg by mouth daily.     atorvastatin 20 MG tablet  Commonly known as:  LIPITOR  Take 20 mg by mouth daily.     cetirizine 10 MG tablet  Commonly known as:  ZYRTEC  Take 10 mg by mouth daily.     finasteride 5 MG tablet  Commonly known as:  PROSCAR  Take 5 mg by mouth daily.     fluticasone 50 MCG/ACT nasal spray  Commonly known as:  FLONASE  Place 1 spray into both nostrils daily.     HYDROcodone-acetaminophen 7.5-325 mg/15 ml solution  Commonly known as:  HYCET  Take 15 mLs by mouth 4 (four) times daily as needed for moderate pain.     omeprazole 20 MG capsule  Commonly known as:  PRILOSEC  Take 20 mg by mouth 2 (two) times daily.     valsartan-hydrochlorothiazide 80-12.5 MG per tablet  Commonly known as:  DIOVAN-HCT  Take 1 tablet by mouth every morning.  Follow-up Information    Follow up with Lajean Saver, MD. Schedule an appointment as soon as possible for a visit in 2 weeks.   Specialty:  General Surgery   Why:  For wound re-check   Contact information:   [address] [place] [address] [phone]       Signed: Marigene Ehlers., Jed Limerick 03/23/2015, 8:12 AM

## 2015-03-23 NOTE — Progress Notes (Signed)
6 Days Post-Op  Subjective: Pt doing much better after IR proc yesterday. Mobilizing well   Objective: Vital signs in last 24 hours: Temp:  [97.8 F (36.6 C)-99.9 F (37.7 C)] 97.8 F (36.6 C) (06/16 0523) Pulse Rate:  [94-108] 94 (06/16 0523) Resp:  [16-20] 20 (06/16 0523) BP: (106-116)/(63-89) 114/73 mmHg (06/16 0523) SpO2:  [95 %-98 %] 96 % (06/16 0523) Last BM Date: 03/21/15  Intake/Output from previous day: 06/15 0701 - 06/16 0700 In: 1800 [P.O.:600; I.V.:1200] Out: 0  Intake/Output this shift:    General appearance: alert and cooperative GI: soft, non-tender; bowel sounds normal; no masses,  no organomegaly and wound c/d/i  Lab Results:   Recent Labs  03/23/15 0520  WBC 14.7*  HGB 10.4*  HCT 31.9*  PLT 219   BMET  Recent Labs  03/23/15 0520  NA 130*  K 3.2*  CL 96*  CO2 27  GLUCOSE 126*  BUN 8  CREATININE 1.09  CALCIUM 7.9*   PT/INR No results for input(s): LABPROT, INR in the last 72 hours. ABG No results for input(s): PHART, HCO3 in the last 72 hours.  Invalid input(s): PCO2, PO2  Studies/Results: Dg Chest 1 View  03/22/2015   CLINICAL DATA:  Status post left thoracentesis.  EXAM: CHEST  1 VIEW  COMPARISON:  Chest CT and radiographs 03/22/2015  FINDINGS: Cardiomediastinal silhouette does not appear significantly changed, with persistent large rounded density projecting over the lower mediastinum consistent with residual fat and fluid containing hernia as described on CT earlier today. There is slightly improved aeration of the left lung, with persistent left basilar opacity corresponding to residual hernia and left basilar atelectasis. No large left-sided pleural effusion is identified. Mild right basilar atelectasis is unchanged. No pneumothorax is identified. Calcified loose bodies in the right shoulder project below the coracoid process, with glenohumeral osteoarthrosis noted.  IMPRESSION: 1. No pneumothorax identified following thoracentesis.  2. Slightly improved aeration of the left lung.   Electronically Signed   By: Sebastian Ache   On: 03/22/2015 16:34   Ct Chest W Contrast  03/22/2015   CLINICAL DATA:  Post hiatal hernia repair, evaluate for fluid collection  EXAM: CT CHEST WITH CONTRAST  TECHNIQUE: Multidetector CT imaging of the chest was performed during intravenous contrast administration.  CONTRAST:  80mL OMNIPAQUE IOHEXOL 300 MG/ML  SOLN  COMPARISON:  Chest radiograph dated 03/12/2015. CT chest dated 09/13/2008.  FINDINGS: Mediastinum/Nodes: Patient is status post Nissen fundoplication with expected postoperative changes including scattered foci of gas (series 2/ images 33 and 39) and minimal layering hemorrhage (series 2/image 49).  Moderate fluid/ascites within a large residual fat containing hernia (series 2/image 41).  Stomach is below the diaphragm. No residual hiatal hernia above the level of the Nissen fundoplication.  The heart is normal in size.  No pericardial effusion.  Coronary atherosclerosis.  Atherosclerotic calcifications of the aortic arch.  No suspicious mediastinal lymphadenopathy.  Visualized thyroid is unremarkable.  Lungs/Pleura: Compressive atelectasis in the left lower lobe.  Trace bilateral pleural effusions.  Mild paraseptal emphysematous changes.  No suspicious pulmonary nodules.  No pneumothorax.  Upper abdomen: Visualized upper abdomen is otherwise notable for a 4.4 cm complex cyst/pseudocyst with calcifications (series 2/image 50), chronic, benign.  Musculoskeletal: Mild degenerative changes of the visualized thoracolumbar spine.  IMPRESSION: Status post Nissen fundoplication with associated postoperative changes, as described above.  Larger residual fat containing hernia. No residual hiatal hernia above the level of the Nissen fundoplication.  Associated moderate fluid/ascites and minimal layering  hemorrhage.   Electronically Signed   By: Charline Bills M.D.   On: 03/22/2015 11:12   Dg Chest Port 1  View  03/22/2015   CLINICAL DATA:  Shortness of breath.  EXAM: PORTABLE CHEST - 1 VIEW  COMPARISON:  03/21/2015.  12/20/2014.  CT 09/13/2008.  FINDINGS: Mediastinum and hilar structures are stable. Stable cardiomegaly with normal pulmonary vascularity. Persistent atelectasis and or infiltrate left lung base. Persistent large hiatal hernia. At no pneumothorax.  IMPRESSION: 1. Persistent left lung base atelectasis and/or infiltrate. 2. Persistent large hiatal hernia. 3. Cardiomegaly, no pulmonary venous congestion.   Electronically Signed   By: Maisie Fus  Register   On: 03/22/2015 07:18   Dg Chest Port 1 View  03/21/2015   CLINICAL DATA:  Shortness of breath. Abdominal distention. Hiatal hernia repair on 03/17/2015.  EXAM: PORTABLE CHEST - 1 VIEW  COMPARISON:  Chest x-rays dated 03/17/2015 and 12/20/2014  FINDINGS: There is atelectasis and small effusion at the left lung base. Heart size and pulmonary vascularity are normal. There has been almost complete clearing of the patchy density in the right midzone with no residual nodularity.  IMPRESSION: Increased atelectasis/ effusion at the left lung base. Almost complete clearing of the right base.   Electronically Signed   By: Francene Boyers M.D.   On: 03/21/2015 08:26   Dg Abd Portable 1v  03/21/2015   CLINICAL DATA:  69 year old male with abdominal distention and pain following hiatal hernia repair last week. Initial encounter.  EXAM: PORTABLE ABDOMEN - 1 VIEW  COMPARISON:  Postoperative esophagram 03/18/2015. Preoperative upper GI series 4416.  FINDINGS: Supine portable views of the abdomen and pelvis. Contrast now throughout the colon. Severe diverticulosis from the splenic flexure to the rectum. Mild to moderate gaseous distention in the epigastrium. The level of the diaphragm is not included. No dilated small or large bowel loops. Stable visualized osseous structures.  IMPRESSION: Normal bowel gas pattern. Residual contrast in the colon. Severe diverticulosis  of the distal colon.   Electronically Signed   By: Odessa Fleming M.D.   On: 03/21/2015 08:25    Anti-infectives: Anti-infectives    Start     Dose/Rate Route Frequency Ordered Stop   03/17/15 1130  ceFAZolin (ANCEF) IVPB 2 g/50 mL premix     2 g 100 mL/hr over 30 Minutes Intravenous  Once 03/17/15 1125 03/17/15 1130   03/17/15 0515  ceFAZolin (ANCEF) IVPB 2 g/50 mL premix     2 g 100 mL/hr over 30 Minutes Intravenous On call to O.R. 03/17/15 0515 03/17/15 0724      Assessment/Plan: s/p Procedure(s): XI ROBOTIC ASSISTED LAPAROSCOPIC NISSEN FUNDOPLICATION (N/A) LAPAROSCOPIC REPAIR OF HIATAL HERNIA INSERTION OF MESH Discharge  LOS: 6 days    Marigene Ehlers., [facility] 03/23/2015

## 2015-04-04 ENCOUNTER — Emergency Department (HOSPITAL_COMMUNITY): Payer: Medicare Other

## 2015-04-04 ENCOUNTER — Emergency Department (HOSPITAL_COMMUNITY)
Admission: EM | Admit: 2015-04-04 | Discharge: 2015-04-04 | Disposition: A | Discharge status: Home / Self Care | Payer: Medicare Other | Attending: Emergency Medicine | Admitting: Emergency Medicine

## 2015-04-04 ENCOUNTER — Encounter (HOSPITAL_COMMUNITY): Payer: Self-pay | Admitting: Emergency Medicine

## 2015-04-04 DIAGNOSIS — Z7982 Long term (current) use of aspirin: Secondary | ICD-10-CM | POA: Diagnosis not present

## 2015-04-04 DIAGNOSIS — R0602 Shortness of breath: Secondary | ICD-10-CM | POA: Diagnosis not present

## 2015-04-04 DIAGNOSIS — E669 Obesity, unspecified: Secondary | ICD-10-CM | POA: Insufficient documentation

## 2015-04-04 DIAGNOSIS — Z7951 Long term (current) use of inhaled steroids: Secondary | ICD-10-CM | POA: Diagnosis not present

## 2015-04-04 DIAGNOSIS — I10 Essential (primary) hypertension: Secondary | ICD-10-CM | POA: Diagnosis not present

## 2015-04-04 DIAGNOSIS — K219 Gastro-esophageal reflux disease without esophagitis: Secondary | ICD-10-CM | POA: Diagnosis not present

## 2015-04-04 DIAGNOSIS — E785 Hyperlipidemia, unspecified: Secondary | ICD-10-CM | POA: Insufficient documentation

## 2015-04-04 DIAGNOSIS — R000 Tachycardia, unspecified: Secondary | ICD-10-CM | POA: Insufficient documentation

## 2015-04-04 DIAGNOSIS — Z79899 Other long term (current) drug therapy: Secondary | ICD-10-CM | POA: Insufficient documentation

## 2015-04-04 DIAGNOSIS — J9811 Atelectasis: Secondary | ICD-10-CM | POA: Diagnosis not present

## 2015-04-04 DIAGNOSIS — Z87891 Personal history of nicotine dependence: Secondary | ICD-10-CM | POA: Insufficient documentation

## 2015-04-04 LAB — CBC WITH DIFFERENTIAL/PLATELET
Basophils Absolute: 0 10*3/uL (ref 0.0–0.1)
Basophils Relative: 0 % (ref 0–1)
Eosinophils Absolute: 0.2 10*3/uL (ref 0.0–0.7)
Eosinophils Relative: 2 % (ref 0–5)
HCT: 36.7 % — ABNORMAL LOW (ref 39.0–52.0)
Hemoglobin: 12.7 g/dL — ABNORMAL LOW (ref 13.0–17.0)
Lymphocytes Relative: 17 % (ref 12–46)
Lymphs Abs: 1.8 10*3/uL (ref 0.7–4.0)
MCH: 29.7 pg (ref 26.0–34.0)
MCHC: 34.6 g/dL (ref 30.0–36.0)
MCV: 85.9 fL (ref 78.0–100.0)
Monocytes Absolute: 0.9 10*3/uL (ref 0.1–1.0)
Monocytes Relative: 9 % (ref 3–12)
Neutro Abs: 7.6 10*3/uL (ref 1.7–7.7)
Neutrophils Relative %: 72 % (ref 43–77)
Platelets: 586 10*3/uL — ABNORMAL HIGH (ref 150–400)
RBC: 4.27 MIL/uL (ref 4.22–5.81)
RDW: 13.2 % (ref 11.5–15.5)
WBC: 10.6 10*3/uL — ABNORMAL HIGH (ref 4.0–10.5)

## 2015-04-04 LAB — I-STAT CHEM 8, ED
BUN: 9 mg/dL (ref 6–20)
Calcium, Ion: 1.13 mmol/L (ref 1.13–1.30)
Chloride: 91 mmol/L — ABNORMAL LOW (ref 101–111)
Creatinine, Ser: 1 mg/dL (ref 0.61–1.24)
Glucose, Bld: 127 mg/dL — ABNORMAL HIGH (ref 65–99)
HCT: 42 % (ref 39.0–52.0)
Hemoglobin: 14.3 g/dL (ref 13.0–17.0)
Potassium: 4.3 mmol/L (ref 3.5–5.1)
Sodium: 124 mmol/L — ABNORMAL LOW (ref 135–145)
TCO2: 22 mmol/L (ref 0–100)

## 2015-04-04 LAB — D-DIMER, QUANTITATIVE (NOT AT ARMC): D-Dimer, Quant: 3.6 ug/mL-FEU — ABNORMAL HIGH (ref 0.00–0.48)

## 2015-04-04 LAB — I-STAT TROPONIN, ED: Troponin i, poc: 0 ng/mL (ref 0.00–0.08)

## 2015-04-04 LAB — BRAIN NATRIURETIC PEPTIDE: B Natriuretic Peptide: 15.6 pg/mL (ref 0.0–100.0)

## 2015-04-04 MED ORDER — SODIUM CHLORIDE 0.9 % IV BOLUS (SEPSIS)
1000.0000 mL | Freq: Once | INTRAVENOUS | Status: AC
  Administered 2015-04-04: 1000 mL via INTRAVENOUS

## 2015-04-04 MED ORDER — IOHEXOL 350 MG/ML SOLN
100.0000 mL | Freq: Once | INTRAVENOUS | Status: AC | PRN
  Administered 2015-04-04: 100 mL via INTRAVENOUS

## 2015-04-04 NOTE — ED Provider Notes (Signed)
CSN: [id number]     Arrival date & time 04/04/15  1544 History   First MD Initiated Contact with Patient 04/04/15 1654     Chief Complaint  Patient presents with  . Shortness of Breath     (Consider location/radiation/quality/duration/timing/severity/associated sxs/prior Treatment) HPI   69 year old male who recently had a hernia surgery on 6/10 presenting for evaluation of shortness of breath. Patient report he had elective hiatal hernia surgery 18 days ago. He was hospitalized and discharge of week ago. 2 days after his surgery he developed worsening shortness of breath in which she was diagnosed with having pleural effusion and had "fluid draws out from my lung" he felt better however his shortness of breath has returned and worsen. He cannot take a deep breath. He has some pleuritic chest discomfort. He has been mostly bedbound at home and has decreased strength with generalized fatigue. He denies having fever, chills, productive cough, hemoptysis, back pain, abdominal pain, or rash. He does endorse persistent diarrhea. No prior history of PE or DVT, no unilateral leg swelling or calf pain. He is not a smoker.  Past Medical History  Diagnosis Date  . Hypertension   . GERD (gastroesophageal reflux disease)   . Hyperlipidemia   . Seasonal allergies   . History of nocturia   . Vertigo   . Shortness of breath dyspnea     with exertion  . History of hiatal hernia   . Obesity (BMI 30-39.9)   . Paraesophageal hiatal hernia s/p robitc repair/fundoplication 03/18/2015 03/18/2015   Past Surgical History  Procedure Laterality Date  . Eye surgery      repaired a detached retina  . Colonoscopy w/ biopsies and polypectomy    . Esophagogastroduodenoscopy N/A 01/13/2015    Procedure: ESOPHAGOGASTRODUODENOSCOPY (EGD);  Surgeon: Jeani Hawking, MD;  Location: [facility] ENDOSCOPY;  Service: Endoscopy;  Laterality: N/A;  . Hiatal hernia repair  03/17/2015    Procedure: LAPAROSCOPIC REPAIR OF HIATAL HERNIA;   Surgeon: Axel Filler, MD;  Location: [facility] ORS;  Service: General;;  . Insertion of mesh  03/17/2015    Procedure: INSERTION OF MESH;  Surgeon: Axel Filler, MD;  Location: [facility] ORS;  Service: General;;   Family History  Problem Relation Age of Onset  . Heart disease Mother   . Hypertension Mother   . Heart disease Father   . Hypertension Father    History  Substance Use Topics  . Smoking status: Former Games developer  . Smokeless tobacco: Current User    Types: Snuff  . Alcohol Use: No     Comment: "quit 1.5 years ago.'    Review of Systems  All other systems reviewed and are negative.     Allergies  Review of patient's allergies indicates no known allergies.  Home Medications   Prior to Admission medications   Medication Sig Start Date End Date Taking? Authorizing Provider  amLODipine (NORVASC) 5 MG tablet Take 5 mg by mouth every morning.     Historical Provider, MD  aspirin EC 81 MG tablet Take 81 mg by mouth daily.    Historical Provider, MD  atorvastatin (LIPITOR) 20 MG tablet Take 20 mg by mouth daily.    Historical Provider, MD  cetirizine (ZYRTEC) 10 MG tablet Take 10 mg by mouth daily.    Historical Provider, MD  finasteride (PROSCAR) 5 MG tablet Take 5 mg by mouth daily.    Historical Provider, MD  fluticasone (FLONASE) 50 MCG/ACT nasal spray Place 1 spray into both nostrils daily.  Historical Provider, MD  HYDROcodone-acetaminophen (HYCET) 7.5-325 mg/15 ml solution Take 15 mLs by mouth 4 (four) times daily as needed for moderate pain. 03/17/15 03/16/16  Axel FillerArmando Ramirez, MD  omeprazole (PRILOSEC) 20 MG capsule Take 20 mg by mouth 2 (two) times daily.     Historical Provider, MD  valsartan-hydrochlorothiazide (DIOVAN-HCT) 80-12.5 MG per tablet Take 1 tablet by mouth every morning.     Historical Provider, MD   Pulse 100  Temp(Src) 97.3 F (36.3 C) (Oral)  Resp 20  Ht 5\' 11"  (1.803 m)  Wt 218 lb 1 oz (98.913 kg)  BMI 30.43 kg/m2  SpO2 97% Physical Exam   Constitutional: He is oriented to person, place, and time. He appears well-developed and well-nourished. No distress.  HENT:  Head: Atraumatic.  Eyes: Conjunctivae are normal.  Neck: Neck supple. No JVD present.  Cardiovascular:  Mild tachycardia, no murmurs rubs or gallops.  Pulmonary/Chest:  Mildly tachypneic and tachycardic. Shallow breath sounds but no rales, rhonchi or wheezes.   Abdominal: There is no tenderness.  Abdomen is mildly distended. Well-healing laparoscopic  abdominal surgical scar without signs of infection. Bowel sounds present  Musculoskeletal: He exhibits no edema or tenderness.  Neurological: He is alert and oriented to person, place, and time.  Skin: No rash noted.  Psychiatric: He has a normal mood and affect.  Nursing note and vitals reviewed.   ED Course  Procedures (including critical care time)  Patient who has recently had an halo hernia surgery and have been mostly bedbound here with worsening shortness of breath. He is mildly tachycardic and tachypneic but no hypoxia and no fever. Concerns for possible PE. Workup initiated.  5:14 PM Elevated d-dimer of 3.6. Will obtain chest CT angiogram for further evaluation. Evidence of hyponatremia with a sodium of 124. Patient is mentating appropriately, no seizure-like activity. Chest x-ray shows no significant changes from prior study. A large hernia in above the diaphragm. No new opacity. Normal BNP. Care discussed with Dr. Estell HarpinZammit.  6:47 PM Chest CT angios shows no central pulmonary embolus. Evidence of a post recent Nissen fundoplication. Residual hernia at the hiatus contained moderate fluid and ascites which is slightly increased from prior exam. I suspect this finding may attribute to his shortness of breath. Patient able to ambulate while maintaining adequate oxygenation. Pt is scheduled to f/u with his surgeon in 2 days.  He is reassured with the result.  Care discussed with Dr. Estell HarpinZammit.    Labs Review Labs  Reviewed  CBC WITH DIFFERENTIAL/PLATELET - Abnormal; Notable for the following:    WBC 10.6 (*)    Hemoglobin 12.7 (*)    HCT 36.7 (*)    Platelets 586 (*)    All other components within normal limits  D-DIMER, QUANTITATIVE (NOT AT [facility]) - Abnormal; Notable for the following:    D-Dimer, Quant 3.60 (*)    All other components within normal limits  I-STAT CHEM 8, ED - Abnormal; Notable for the following:    Sodium 124 (*)    Chloride 91 (*)    Glucose, Bld 127 (*)    All other components within normal limits  BRAIN NATRIURETIC PEPTIDE  I-STAT TROPOININ, ED    Imaging Review Dg Chest 2 View  04/04/2015   CLINICAL DATA:  Shortness of breath and chest pressure for 2 days  EXAM: CHEST  2 VIEW  COMPARISON:  March 23, 2015 chest radiograph; March 22, 2015 chest CT  FINDINGS: There is extensive hernia above the diaphragm, stable. There  is atelectatic change in the left lower lobe, stable. No new opacity. Heart size and pulmonary vascularity are normal. No adenopathy. No bone lesions. No pneumothorax.  IMPRESSION: No change from recent prior study. Large hernia above the diaphragm. Left lower lobe atelectasis remains. No new opacity. No change in cardiac silhouette.   Electronically Signed   By: Bretta Bang III M.D.   On: 04/04/2015 16:47     EKG Interpretation None      Date: 04/04/2015  Rate: 98  Rhythm: normal sinus rhythm  QRS Axis: normal  Intervals: normal  ST/T Wave abnormalities: normal  Conduction Disutrbances: none  Narrative Interpretation:   Old EKG Reviewed: No significant changes noted     MDM   Final diagnoses:  SOB (shortness of breath)   BP 104/78 mmHg  Pulse 87  Temp(Src) 97.3 F (36.3 C) (Oral)  Resp 20  Ht 5\' 11"  (1.803 m)  Wt 218 lb 1 oz (98.913 kg)  BMI 30.43 kg/m2  SpO2 98%  I have reviewed nursing notes and vital signs. I personally viewed the imaging tests through PACS system and agrees with radiologist's intepretation I reviewed available  ER/hospitalization records through the EMR     Fayrene Helper, PA-C 04/04/15 2044  Bethann Berkshire, MD 04/07/15 1356

## 2015-04-04 NOTE — ED Notes (Signed)
Patient ambulated in the hallways around the nurses station 3 times SpO2 94-100%/RA with good wave form, HR 101-108bpm

## 2015-04-04 NOTE — ED Notes (Signed)
Pt c/o shortness of breath, had hernia surg on 6/10  Pt st's he has had shortness of breath for a while but became worse today,.  Also st's he has had a pressure type pain in left chest earlier today,  Worse with deep breath

## 2015-04-04 NOTE — Discharge Instructions (Signed)
Please follow-up with your surgeon as previously schedule for further evaluation. Your shortness of breath may be related to recent surgery. Your sodium level is low today, will also need to be rechecked by your doctor. Return to the ER if you have worsening symptoms or if you have any other concern. No evidence of any blood clot was identified today.  Shortness of Breath Shortness of breath means you have trouble breathing. Shortness of breath needs medical care right away. HOME CARE   Do not smoke.  Avoid being around chemicals or things (paint fumes, dust) that may bother your breathing.  Rest as needed. Slowly begin your normal activities.  Only take medicines as told by your doctor.  Keep all doctor visits as told. GET HELP RIGHT AWAY IF:   Your shortness of breath gets worse.  You feel lightheaded, pass out (faint), or have a cough that is not helped by medicine.  You cough up blood.  You have pain with breathing.  You have pain in your chest, arms, shoulders, or belly (abdomen).  You have a fever.  You cannot walk up stairs or exercise the way you normally do.  You do not get better in the time expected.  You have a hard time doing normal activities even with rest.  You have problems with your medicines.  You have any new symptoms. MAKE SURE YOU:  Understand these instructions.  Will watch your condition.  Will get help right away if you are not doing well or get worse. Document Released: 03/11/2008 Document Revised: 09/28/2013 Document Reviewed: 12/09/2011 [facility] Patient Information 2015 [employer] [place]. This information is not intended to replace advice given to you by your health care provider. Make sure you discuss any questions you have with your health care provider.

## 2015-07-05 DIAGNOSIS — R112 Nausea with vomiting, unspecified: Secondary | ICD-10-CM | POA: Diagnosis not present

## 2015-07-05 DIAGNOSIS — R1310 Dysphagia, unspecified: Secondary | ICD-10-CM | POA: Diagnosis not present

## 2015-07-11 DIAGNOSIS — R1310 Dysphagia, unspecified: Secondary | ICD-10-CM | POA: Diagnosis not present

## 2015-09-14 DIAGNOSIS — N62 Hypertrophy of breast: Secondary | ICD-10-CM | POA: Diagnosis not present

## 2015-09-18 IMAGING — DX DG CHEST 1V PORT
1 series · 1 of 1 positions shown · non-contrast
Comparison: 03/21/2015.  12/20/2014.  CT 09/13/2008.

CLINICAL DATA: Shortness of breath.

EXAM:
PORTABLE CHEST - 1 VIEW

[chest ap]
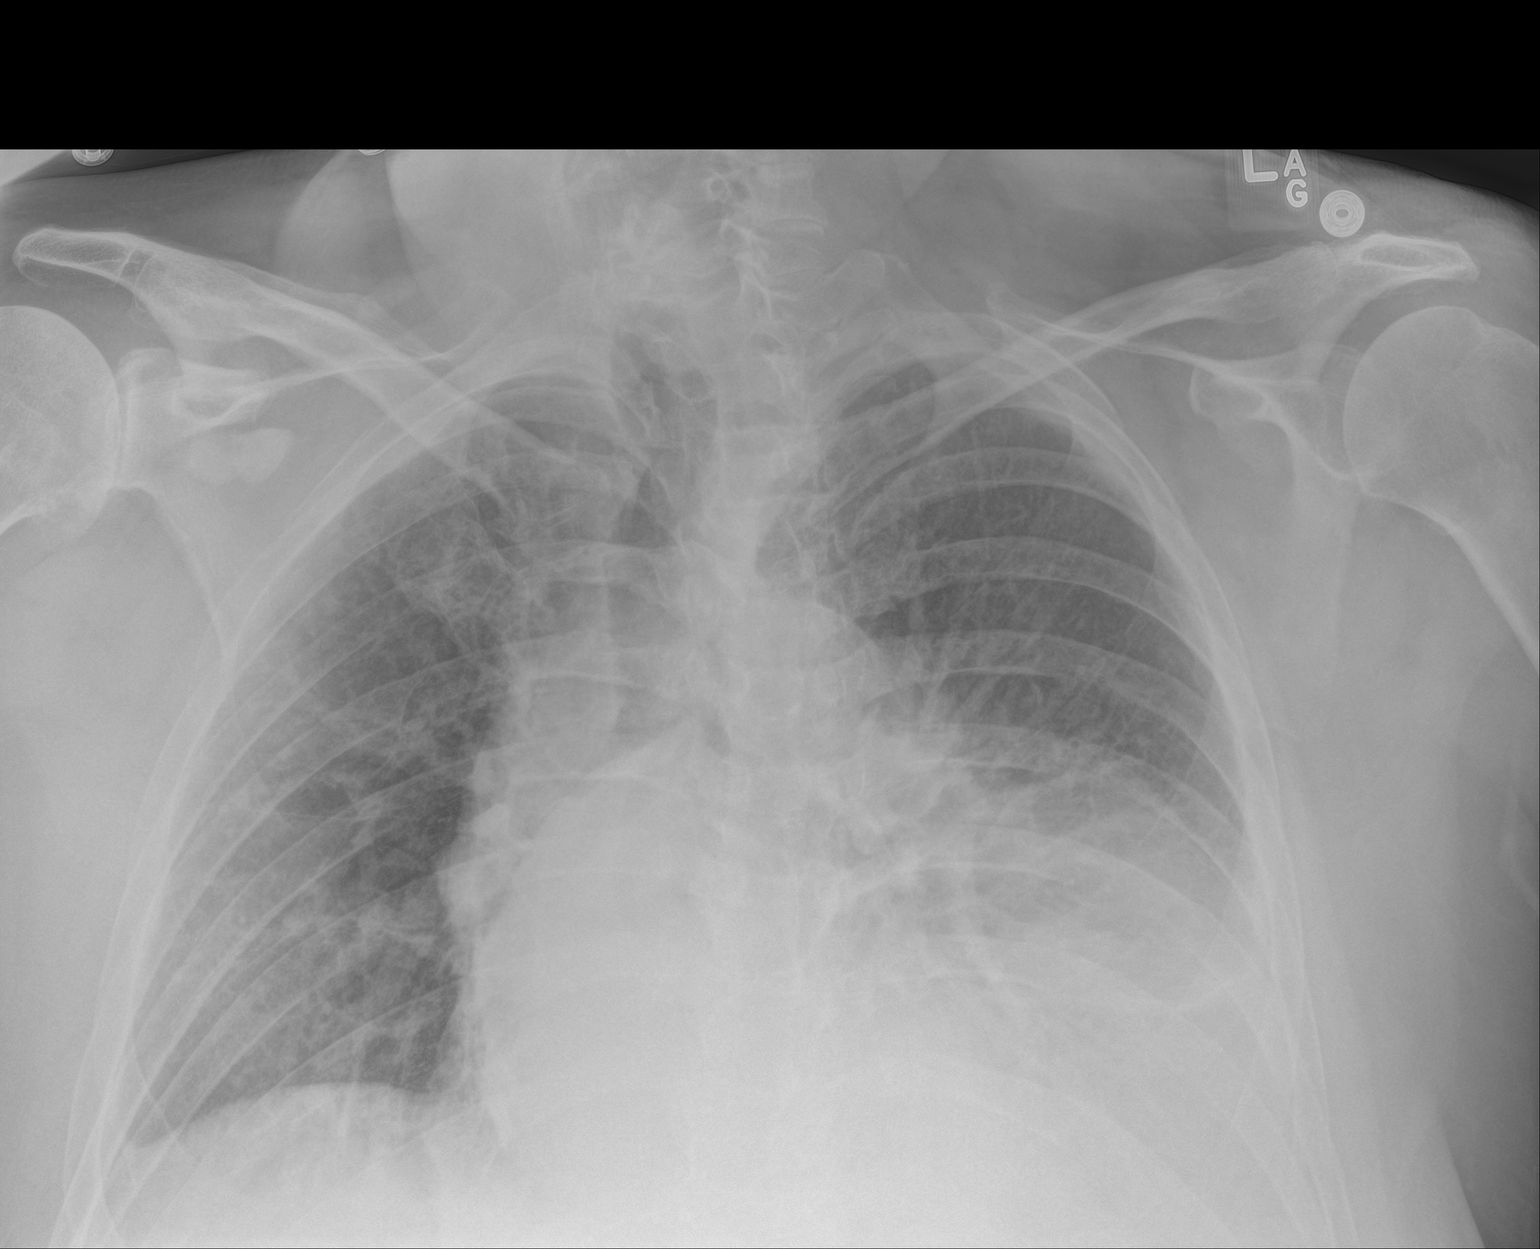

[1 of 1 positions shown; findings below may reference images not displayed]

FINDINGS: Mediastinum and hilar structures are stable. Stable cardiomegaly
with normal pulmonary vascularity. Persistent atelectasis and or
infiltrate left lung base. Persistent large hiatal hernia. At no
pneumothorax.
IMPRESSION: 1. Persistent left lung base atelectasis and/or infiltrate.
2. Persistent large hiatal hernia.
3. Cardiomegaly, no pulmonary venous congestion.

## 2015-09-27 DIAGNOSIS — N62 Hypertrophy of breast: Secondary | ICD-10-CM | POA: Diagnosis not present

## 2015-09-27 DIAGNOSIS — N644 Mastodynia: Secondary | ICD-10-CM | POA: Diagnosis not present

## 2016-03-28 DIAGNOSIS — H04123 Dry eye syndrome of bilateral lacrimal glands: Secondary | ICD-10-CM | POA: Diagnosis not present

## 2016-03-28 DIAGNOSIS — H40033 Anatomical narrow angle, bilateral: Secondary | ICD-10-CM | POA: Diagnosis not present

## 2016-08-14 DIAGNOSIS — E7800 Pure hypercholesterolemia, unspecified: Secondary | ICD-10-CM | POA: Diagnosis not present

## 2016-08-14 DIAGNOSIS — G4700 Insomnia, unspecified: Secondary | ICD-10-CM | POA: Diagnosis not present

## 2016-08-14 DIAGNOSIS — R5381 Other malaise: Secondary | ICD-10-CM | POA: Diagnosis not present

## 2016-08-14 DIAGNOSIS — E299 Testicular dysfunction, unspecified: Secondary | ICD-10-CM | POA: Diagnosis not present

## 2016-08-14 DIAGNOSIS — I10 Essential (primary) hypertension: Secondary | ICD-10-CM | POA: Diagnosis not present

## 2016-08-14 DIAGNOSIS — R5383 Other fatigue: Secondary | ICD-10-CM | POA: Diagnosis not present

## 2016-08-14 DIAGNOSIS — E559 Vitamin D deficiency, unspecified: Secondary | ICD-10-CM | POA: Diagnosis not present

## 2016-11-11 DIAGNOSIS — R0602 Shortness of breath: Secondary | ICD-10-CM | POA: Diagnosis not present

## 2016-11-11 DIAGNOSIS — R141 Gas pain: Secondary | ICD-10-CM | POA: Diagnosis not present

## 2016-12-05 ENCOUNTER — Ambulatory Visit (INDEPENDENT_AMBULATORY_CARE_PROVIDER_SITE_OTHER): Payer: Medicare Other

## 2016-12-05 ENCOUNTER — Ambulatory Visit (INDEPENDENT_AMBULATORY_CARE_PROVIDER_SITE_OTHER): Payer: Medicare Other | Admitting: Family Medicine

## 2016-12-05 ENCOUNTER — Encounter: Payer: Self-pay | Admitting: Family Medicine

## 2016-12-05 VITALS — BP 121/85 | HR 78 | Temp 98.5°F | Ht 71.0 in | Wt 208.5 lb

## 2016-12-05 DIAGNOSIS — R739 Hyperglycemia, unspecified: Secondary | ICD-10-CM

## 2016-12-05 DIAGNOSIS — R0602 Shortness of breath: Secondary | ICD-10-CM

## 2016-12-05 LAB — BAYER DCA HB A1C WAIVED: HB A1C (BAYER DCA - WAIVED): 6 % (ref <7.0)

## 2016-12-05 MED ORDER — FENOFIBRATE 145 MG PO TABS: 145.0000 mg | ORAL_TABLET | Freq: Every day | ORAL | 1 refills | Status: DC

## 2016-12-05 NOTE — Progress Notes (Signed)
BP 121/85   Pulse 78   Temp 98.5 F (36.9 C) (Oral)   Ht 5\' 11"  (1.803 m)   Wt 208 lb 8 oz (94.6 kg)   BMI 29.08 kg/m    Subjective:    Patient ID: Christopher Mata, male    DOB: 05/09/1946, 71 y.o.   MRN: [id number]  HPI: Christopher Mata is a 71 y.o. male presenting on 12/05/2016 for Establish Care (wants referral to pulmonologist - has SOB with activity ongoing for years)   HPI Shortness of breath on exertion Patient has been having increasing shortness of breath on exertion over the past few years. He says it is really gotten worse over the past few months and that he can't walk up a flight of stairs without getting winded very Walk to the independent block and back without getting winded. He says he will get short of breath enough that he has to sit down for a couple minutes and catch his breath and then he can continue going. He denies any wheezing or coughing spells during the day or in the evening. He denies any recurrent bronchitis. He denies any shortness of breath with sleeping or laying flat. Eyes any chest pain or palpitations associated with the shortness of breath. He denies any shortness of breath at baseline.  Relevant past medical, surgical, family and social history reviewed and updated as indicated. Interim medical history since our last visit reviewed. Allergies and medications reviewed and updated.  Review of Systems  Constitutional: Negative for chills and fever.  Respiratory: Positive for shortness of breath. Negative for cough, chest tightness and wheezing.   Cardiovascular: Negative for chest pain, palpitations and leg swelling.  Gastrointestinal: Negative for abdominal pain.  Musculoskeletal: Negative for back pain and gait problem.  Skin: Negative for rash.  Neurological: Negative for weakness and numbness.  All other systems reviewed and are negative.   Per HPI unless specifically indicated above  Social History   Social History  . Marital status:  Married    Spouse name: N/A  . Number of children: N/A  . Years of education: N/A   Occupational History  . Not on file.   Social History Main Topics  . Smoking status: Former Smoker    Packs/day: 1.00    Years: 20.00    Quit date: 12/06/1986  . Smokeless tobacco: Current User    Types: Snuff  . Alcohol use No     Comment: "quit 1.5 years ago.'  . Drug use: No  . Sexual activity: Not Currently   Other Topics Concern  . Not on file   Social History Narrative  . No narrative on file    Past Surgical History:  Procedure Laterality Date  . COLONOSCOPY W/ BIOPSIES AND POLYPECTOMY    . ESOPHAGOGASTRODUODENOSCOPY N/A 01/13/2015   Procedure: ESOPHAGOGASTRODUODENOSCOPY (EGD);  Surgeon: Jeani Hawking, MD;  Location: [facility] ENDOSCOPY;  Service: Endoscopy;  Laterality: N/A;  . EYE SURGERY     repaired a detached retina  . HIATAL HERNIA REPAIR  03/17/2015   Procedure: LAPAROSCOPIC REPAIR OF HIATAL HERNIA;  Surgeon: Axel Filler, MD;  Location: [facility] ORS;  Service: General;;  . INSERTION OF MESH  03/17/2015   Procedure: INSERTION OF MESH;  Surgeon: Axel Filler, MD;  Location: [facility] ORS;  Service: General;;    Family History  Problem Relation Age of Onset  . Heart disease Mother   . Hypertension Mother   . Heart disease Father   . Hypertension Father  Allergies as of 12/05/2016   No Known Allergies     Medication List       Accurate as of 12/05/16  9:37 AM. Always use your most recent med list.          aspirin EC 81 MG tablet Take 81 mg by mouth daily.   atorvastatin 20 MG tablet Commonly known as:  LIPITOR Take 20 mg by mouth daily.   fenofibrate 145 MG tablet Commonly known as:  TRICOR Take 1 tablet (145 mg total) by mouth daily.   finasteride 5 MG tablet Commonly known as:  PROSCAR Take 5 mg by mouth daily.   metoprolol tartrate 25 MG tablet Commonly known as:  LOPRESSOR Take 25 mg by mouth 2 (two) times daily.   valsartan-hydrochlorothiazide 80-12.5 MG  tablet Commonly known as:  DIOVAN-HCT Take 1 tablet by mouth every morning.          Objective:    BP 121/85   Pulse 78   Temp 98.5 F (36.9 C) (Oral)   Ht 5\' 11"  (1.803 m)   Wt 208 lb 8 oz (94.6 kg)   BMI 29.08 kg/m   Wt Readings from Last 3 Encounters:  12/05/16 208 lb 8 oz (94.6 kg)  04/04/15 218 lb 1 oz (98.9 kg)  03/17/15 228 lb (103.4 kg)    Physical Exam  Constitutional: He is oriented to person, place, and time. He appears well-developed and well-nourished. No distress.  Eyes: Conjunctivae are normal. No scleral icterus.  Neck: Neck supple. No thyromegaly present.  Cardiovascular: Normal rate, regular rhythm, normal heart sounds and intact distal pulses.   No murmur heard. Pulmonary/Chest: Effort normal. No respiratory distress. He has no wheezes. He has rales (Bibasilar crackles). He exhibits no tenderness.  Musculoskeletal: Normal range of motion. He exhibits no edema.  Lymphadenopathy:    He has no cervical adenopathy.  Neurological: He is alert and oriented to person, place, and time. Coordination normal.  Skin: Skin is warm and dry. No rash noted. He is not diaphoretic.  Psychiatric: He has a normal mood and affect. His behavior is normal.  Nursing note and vitals reviewed.  Chest x-ray: Shows a slight flattening in the corners of his lungs on the right side but not the left. No excessive amount of pulmonary congestion noted. Await final read by radiologist.  EKG: Normal sinus rhythm    Assessment & Plan:   Problem List Items Addressed This Visit    None    Visit Diagnoses    Shortness of breath on exertion    -  Primary   Relevant Orders   DG Chest 2 View (Completed)   EKG 12-Lead (Completed)   ECHOCARDIOGRAM COMPLETE   Elevated blood sugar       Relevant Orders   Bayer DCA Hb A1c Waived       Follow up plan: Return if symptoms worsen or fail to improve.  Arville CareJoshua Verdis Koval, MD [facility] 12/05/2016, 9:37 AM

## 2016-12-11 MED ORDER — AMOXICILLIN-POT CLAVULANATE 875-125 MG PO TABS: 1.0000 | ORAL_TABLET | Freq: Two times a day (BID) | ORAL | 0 refills | Status: DC

## 2016-12-18 ENCOUNTER — Ambulatory Visit (INDEPENDENT_AMBULATORY_CARE_PROVIDER_SITE_OTHER): Payer: Medicare Other

## 2016-12-18 ENCOUNTER — Other Ambulatory Visit: Payer: Self-pay

## 2016-12-18 DIAGNOSIS — R0602 Shortness of breath: Secondary | ICD-10-CM | POA: Diagnosis not present

## 2016-12-19 ENCOUNTER — Ambulatory Visit: Payer: Medicare Other | Admitting: Pharmacist

## 2017-01-13 ENCOUNTER — Other Ambulatory Visit: Payer: Self-pay | Admitting: Family Medicine

## 2017-03-31 ENCOUNTER — Other Ambulatory Visit: Payer: Self-pay

## 2017-03-31 MED ORDER — METOPROLOL TARTRATE 25 MG PO TABS: 25.0000 mg | ORAL_TABLET | Freq: Two times a day (BID) | ORAL | 0 refills | Status: DC

## 2017-04-12 ENCOUNTER — Other Ambulatory Visit: Payer: Self-pay | Admitting: Family Medicine

## 2017-04-14 NOTE — Telephone Encounter (Signed)
Do not see a lipid in EPIC

## 2017-04-21 ENCOUNTER — Other Ambulatory Visit: Payer: Self-pay | Admitting: Family Medicine

## 2017-04-30 ENCOUNTER — Telehealth: Payer: Self-pay | Admitting: Family Medicine

## 2017-04-30 MED ORDER — IRBESARTAN-HYDROCHLOROTHIAZIDE 150-12.5 MG PO TABS: 1.0000 | ORAL_TABLET | Freq: Every day | ORAL | 2 refills | Status: DC

## 2017-04-30 NOTE — Telephone Encounter (Signed)
Please advise on new medication 

## 2017-04-30 NOTE — Telephone Encounter (Signed)
Aware. 

## 2017-05-23 ENCOUNTER — Ambulatory Visit (INDEPENDENT_AMBULATORY_CARE_PROVIDER_SITE_OTHER): Payer: Medicare Other | Admitting: Family Medicine

## 2017-05-23 ENCOUNTER — Encounter: Payer: Self-pay | Admitting: Family Medicine

## 2017-05-23 VITALS — BP 103/71 | HR 61 | Temp 98.0°F | Ht 71.0 in | Wt 201.0 lb

## 2017-05-23 DIAGNOSIS — K2100 Gastro-esophageal reflux disease with esophagitis, without bleeding: Secondary | ICD-10-CM

## 2017-05-23 MED ORDER — OMEPRAZOLE 20 MG PO CPDR: 20.0000 mg | DELAYED_RELEASE_CAPSULE | Freq: Every day | ORAL | 3 refills | Status: DC

## 2017-05-23 NOTE — Progress Notes (Signed)
BP 103/71   Pulse 61   Temp 98 F (36.7 C) (Oral)   Ht 5\' 11"  (1.803 m)   Wt 201 lb (91.2 kg)   BMI 28.03 kg/m    Subjective:    Patient ID: Christopher Mata, male    DOB: [birth year], 71 y.o.   MRN: [id number]  HPI: FLYNT SCHRECKENGOST is a 71 y.o. male presenting on 05/23/2017 for Heartburn (had been on Omeprazole in the past)   HPI GERD Patient is coming in for acid reflux. He had a Nissen fundoplication and was doing well for quite some time but over the past few months of the started to creep up on him again. He says it does not bother him all of the time. He denies any bloody stools or dark tarry stools. He denies any nausea or vomiting. He says it's more at night than during the day but is not every day currently. He would like to get back on the omeprazole that he was on previously before he had the Nissen fundoplication.  Relevant past medical, surgical, family and social history reviewed and updated as indicated. Interim medical history since our last visit reviewed. Allergies and medications reviewed and updated.  Review of Systems  Constitutional: Negative for chills and fever.  Respiratory: Negative for shortness of breath and wheezing.   Cardiovascular: Negative for chest pain and leg swelling.  Gastrointestinal: Positive for abdominal pain. Negative for blood in stool, constipation, diarrhea, nausea and vomiting.  Musculoskeletal: Negative for back pain and gait problem.  Skin: Negative for rash.  All other systems reviewed and are negative.   Per HPI unless specifically indicated above        Objective:    BP 103/71   Pulse 61   Temp 98 F (36.7 C) (Oral)   Ht 5\' 11"  (1.803 m)   Wt 201 lb (91.2 kg)   BMI 28.03 kg/m   Wt Readings from Last 3 Encounters:  05/23/17 201 lb (91.2 kg)  12/05/16 208 lb 8 oz (94.6 kg)  04/04/15 218 lb 1 oz (98.9 kg)    Physical Exam  Constitutional: He is oriented to person, place, and time. He appears well-developed and  well-nourished. No distress.  Eyes: Conjunctivae are normal. No scleral icterus.  Cardiovascular: Normal rate, regular rhythm, normal heart sounds and intact distal pulses.   No murmur heard. Pulmonary/Chest: Effort normal and breath sounds normal. No respiratory distress. He has no wheezes.  Abdominal: Soft. Bowel sounds are normal. He exhibits no distension. There is no tenderness. There is no rebound.  Musculoskeletal: Normal range of motion. He exhibits no edema.  Neurological: He is alert and oriented to person, place, and time. Coordination normal.  Skin: Skin is warm and dry. No rash noted. He is not diaphoretic.  Psychiatric: He has a normal mood and affect. His behavior is normal.  Nursing note and vitals reviewed.     Assessment & Plan:   Problem List Items Addressed This Visit      Digestive   GERD (gastroesophageal reflux disease) - Primary   Relevant Medications   omeprazole (PRILOSEC) 20 MG capsule       Follow up plan: Return in about 6 months (around 11/23/2017), or if symptoms worsen or fail to improve, for Physical and fasting labs.  Counseling provided for all of the vaccine components No orders of the defined types were placed in this encounter.   Arville Care, MD [facility] 05/23/2017, 9:04 AM

## 2017-05-24 ENCOUNTER — Other Ambulatory Visit: Payer: Self-pay | Admitting: Family Medicine

## 2017-06-28 ENCOUNTER — Other Ambulatory Visit: Payer: Self-pay | Admitting: Family Medicine

## 2017-07-11 ENCOUNTER — Other Ambulatory Visit: Payer: Self-pay | Admitting: Family Medicine

## 2017-07-14 ENCOUNTER — Other Ambulatory Visit: Payer: Self-pay | Admitting: Family Medicine

## 2017-08-12 DIAGNOSIS — H35373 Puckering of macula, bilateral: Secondary | ICD-10-CM | POA: Diagnosis not present

## 2017-08-12 DIAGNOSIS — H2513 Age-related nuclear cataract, bilateral: Secondary | ICD-10-CM | POA: Diagnosis not present

## 2017-08-12 DIAGNOSIS — H43391 Other vitreous opacities, right eye: Secondary | ICD-10-CM | POA: Diagnosis not present

## 2017-08-12 DIAGNOSIS — H43811 Vitreous degeneration, right eye: Secondary | ICD-10-CM | POA: Diagnosis not present

## 2017-08-18 DIAGNOSIS — H2513 Age-related nuclear cataract, bilateral: Secondary | ICD-10-CM | POA: Diagnosis not present

## 2017-08-25 DIAGNOSIS — H2513 Age-related nuclear cataract, bilateral: Secondary | ICD-10-CM | POA: Diagnosis not present

## 2017-09-01 ENCOUNTER — Other Ambulatory Visit: Payer: Self-pay | Admitting: Family Medicine

## 2017-09-09 DIAGNOSIS — H2512 Age-related nuclear cataract, left eye: Secondary | ICD-10-CM | POA: Diagnosis not present

## 2017-09-09 DIAGNOSIS — H25812 Combined forms of age-related cataract, left eye: Secondary | ICD-10-CM | POA: Diagnosis not present

## 2017-09-10 DIAGNOSIS — H2511 Age-related nuclear cataract, right eye: Secondary | ICD-10-CM | POA: Diagnosis not present

## 2017-09-23 DIAGNOSIS — H2511 Age-related nuclear cataract, right eye: Secondary | ICD-10-CM | POA: Diagnosis not present

## 2017-09-23 DIAGNOSIS — H25811 Combined forms of age-related cataract, right eye: Secondary | ICD-10-CM | POA: Diagnosis not present

## 2017-09-27 ENCOUNTER — Other Ambulatory Visit: Payer: Self-pay | Admitting: Family Medicine

## 2017-10-09 ENCOUNTER — Other Ambulatory Visit: Payer: Self-pay | Admitting: Family Medicine

## 2017-10-10 ENCOUNTER — Other Ambulatory Visit: Payer: Self-pay | Admitting: Family Medicine

## 2017-10-16 ENCOUNTER — Ambulatory Visit: Payer: Medicare Other | Admitting: *Deleted

## 2017-11-10 DIAGNOSIS — H16223 Keratoconjunctivitis sicca, not specified as Sjogren's, bilateral: Secondary | ICD-10-CM | POA: Diagnosis not present

## 2017-11-12 DIAGNOSIS — L718 Other rosacea: Secondary | ICD-10-CM | POA: Diagnosis not present

## 2017-11-23 ENCOUNTER — Other Ambulatory Visit: Payer: Self-pay | Admitting: Family Medicine

## 2017-11-24 NOTE — Telephone Encounter (Signed)
No lipid in EPIC 

## 2018-01-01 ENCOUNTER — Other Ambulatory Visit: Payer: Self-pay | Admitting: Family Medicine

## 2018-01-01 NOTE — Telephone Encounter (Signed)
Last seen 05/23/17  Dr D

## 2018-01-06 ENCOUNTER — Other Ambulatory Visit: Payer: Self-pay | Admitting: Family Medicine

## 2018-01-06 NOTE — Telephone Encounter (Signed)
Last seen 05/23/17  Dr Dettinger  No lipid in [employer]

## 2018-01-10 ENCOUNTER — Other Ambulatory Visit: Payer: Self-pay | Admitting: Family Medicine

## 2018-01-12 NOTE — Telephone Encounter (Signed)
LM- rx sent - call office to set up follow up

## 2018-01-12 NOTE — Telephone Encounter (Signed)
Will send refill but patient needs appointment ASAP

## 2018-02-17 ENCOUNTER — Other Ambulatory Visit: Payer: Self-pay | Admitting: Family Medicine

## 2018-03-25 ENCOUNTER — Other Ambulatory Visit: Payer: Self-pay | Admitting: Family Medicine

## 2018-03-30 ENCOUNTER — Other Ambulatory Visit: Payer: Self-pay | Admitting: Family Medicine

## 2018-03-31 NOTE — Telephone Encounter (Signed)
Last lipid 2015

## 2018-04-03 ENCOUNTER — Other Ambulatory Visit: Payer: Self-pay | Admitting: Family Medicine

## 2018-04-04 ENCOUNTER — Other Ambulatory Visit: Payer: Self-pay | Admitting: Family Medicine

## 2018-04-13 ENCOUNTER — Other Ambulatory Visit: Payer: Self-pay | Admitting: Family Medicine

## 2018-04-17 ENCOUNTER — Encounter: Payer: Self-pay | Admitting: Family Medicine

## 2018-04-17 ENCOUNTER — Ambulatory Visit (INDEPENDENT_AMBULATORY_CARE_PROVIDER_SITE_OTHER): Payer: Medicare Other | Admitting: Family Medicine

## 2018-04-17 VITALS — BP 114/73 | HR 66 | Temp 98.0°F | Ht 71.0 in | Wt 215.0 lb

## 2018-04-17 DIAGNOSIS — R7303 Prediabetes: Secondary | ICD-10-CM | POA: Diagnosis not present

## 2018-04-17 DIAGNOSIS — E785 Hyperlipidemia, unspecified: Secondary | ICD-10-CM | POA: Diagnosis not present

## 2018-04-17 DIAGNOSIS — Z23 Encounter for immunization: Secondary | ICD-10-CM

## 2018-04-17 DIAGNOSIS — K2100 Gastro-esophageal reflux disease with esophagitis, without bleeding: Secondary | ICD-10-CM

## 2018-04-17 DIAGNOSIS — R3914 Feeling of incomplete bladder emptying: Secondary | ICD-10-CM

## 2018-04-17 DIAGNOSIS — N401 Enlarged prostate with lower urinary tract symptoms: Secondary | ICD-10-CM | POA: Diagnosis not present

## 2018-04-17 DIAGNOSIS — E663 Overweight: Secondary | ICD-10-CM | POA: Diagnosis not present

## 2018-04-17 DIAGNOSIS — I10 Essential (primary) hypertension: Secondary | ICD-10-CM | POA: Diagnosis not present

## 2018-04-17 LAB — BAYER DCA HB A1C WAIVED: HB A1C (BAYER DCA - WAIVED): 6 % (ref <7.0)

## 2018-04-17 MED ORDER — OMEPRAZOLE 20 MG PO CPDR: 20.0000 mg | DELAYED_RELEASE_CAPSULE | Freq: Every day | ORAL | 3 refills | Status: DC

## 2018-04-17 MED ORDER — VALSARTAN-HYDROCHLOROTHIAZIDE 80-12.5 MG PO TABS: ORAL_TABLET | ORAL | 3 refills | Status: DC

## 2018-04-17 MED ORDER — FENOFIBRATE 145 MG PO TABS: 145.0000 mg | ORAL_TABLET | Freq: Every day | ORAL | 3 refills | Status: DC

## 2018-04-17 MED ORDER — FINASTERIDE 5 MG PO TABS: 5.0000 mg | ORAL_TABLET | Freq: Every day | ORAL | 3 refills | Status: DC

## 2018-04-17 MED ORDER — METOPROLOL TARTRATE 25 MG PO TABS: 25.0000 mg | ORAL_TABLET | Freq: Two times a day (BID) | ORAL | 3 refills | Status: DC

## 2018-04-17 MED ORDER — ATORVASTATIN CALCIUM 20 MG PO TABS: ORAL_TABLET | ORAL | 3 refills | Status: DC

## 2018-04-17 NOTE — Addendum Note (Signed)
Addended by: Quay BurowPOTTER, JANICE K on: 04/17/2018 09:27 AM   Modules accepted: Orders

## 2018-04-17 NOTE — Progress Notes (Signed)
BP 114/73   Pulse 66   Temp 98 F (36.7 C) (Oral)   Ht 5' 11"  (1.803 m)   Wt 215 lb (97.5 kg)   BMI 29.99 kg/m    Subjective:    Patient ID: Christopher Mata, male    DOB: [birth year], 72 y.o.   MRN: [id number]  HPI: Christopher Mata is a 72 y.o. male presenting on 04/17/2018 for Medication Refill   HPI Hyperlipidemia Patient is coming in for recheck of his hyperlipidemia. The patient is currently taking atorvastatin and fenofibrate. They deny any issues with myalgias or history of liver damage from it. They deny any focal numbness or weakness or chest pain.   GERD Patient is currently on omeprazole.  She denies any major symptoms or abdominal pain or belching or burping. She denies any blood in her stool or lightheadedness or dizziness.   Prediabetes Patient comes in today for recheck of his diabetes. Patient has been currently taking no medication is on diet control, he has lost some weight and we will recheck to see where it sat.  His last A1c was 6.0. Patient is currently on an ACE inhibitor/ARB. Patient has not seen an ophthalmologist this year. Patient denies any issues with their feet.   Hypertension Patient is currently on valsartan-hydrochlorothiazide and metoprolol, and their blood pressure today is 114/73. Patient denies any lightheadedness or dizziness. Patient denies headaches, blurred vision, chest pains, shortness of breath, or weakness. Denies any side effects from medication and is content with current medication.   BPH Patient is coming in for BPH recheck.  He says he still has some incomplete emptying but he says he has a good stream and is able to start and stop his stream without much issue.  He says he only wakes up currently once at night and is pretty satisfied with worries that for now.  Patient is currently taking finasteride  Relevant past medical, surgical, family and social history reviewed and updated as indicated. Interim medical history since our last  visit reviewed. Allergies and medications reviewed and updated.  Review of Systems  Constitutional: Negative for chills and fever.  Eyes: Negative for discharge.  Respiratory: Negative for shortness of breath and wheezing.   Cardiovascular: Negative for chest pain and leg swelling.  Gastrointestinal: Negative for abdominal pain, nausea and vomiting.  Genitourinary: Negative for decreased urine volume, difficulty urinating and urgency.  Musculoskeletal: Negative for back pain and gait problem.  Skin: Negative for rash.  Neurological: Negative for dizziness, weakness, light-headedness and numbness.  All other systems reviewed and are negative.   Per HPI unless specifically indicated above   Allergies as of 04/17/2018   No Known Allergies     Medication List        Accurate as of 04/17/18  8:11 AM. Always use your most recent med list.          aspirin EC 81 MG tablet Take 81 mg by mouth daily.   atorvastatin 20 MG tablet Commonly known as:  LIPITOR TAKE 1 TABLET BY MOUTH EVERYDAY AT BEDTIME   fenofibrate 145 MG tablet Commonly known as:  TRICOR TAKE 1 TABLET BY MOUTH EVERY DAY   finasteride 5 MG tablet Commonly known as:  PROSCAR TAKE 1 TABLET DAILY   metoprolol tartrate 25 MG tablet Commonly known as:  LOPRESSOR TAKE 1 TABLET BY MOUTH TWICE A DAY   omeprazole 20 MG capsule Commonly known as:  PRILOSEC Take 1 capsule (20 mg total) by mouth daily.  valsartan-hydrochlorothiazide 80-12.5 MG tablet Commonly known as:  DIOVAN-HCT TAKE 1 TABLET BY MOUTH EVERY DAY IN THE MORNING          Objective:    BP 114/73   Pulse 66   Temp 98 F (36.7 C) (Oral)   Ht 5' 11"  (1.803 m)   Wt 215 lb (97.5 kg)   BMI 29.99 kg/m   Wt Readings from Last 3 Encounters:  04/17/18 215 lb (97.5 kg)  05/23/17 201 lb (91.2 kg)  12/05/16 208 lb 8 oz (94.6 kg)    Physical Exam  Constitutional: He is oriented to person, place, and time. He appears well-developed and  well-nourished. No distress.  Eyes: Conjunctivae are normal. No scleral icterus.  Neck: Neck supple. No thyromegaly present.  Cardiovascular: Normal rate, regular rhythm, normal heart sounds and intact distal pulses.  No murmur heard. Pulmonary/Chest: Effort normal and breath sounds normal. No respiratory distress. He has no wheezes.  Abdominal: Soft. Bowel sounds are normal. He exhibits no distension. There is no tenderness. There is no guarding.  Musculoskeletal: Normal range of motion. He exhibits no edema.  Lymphadenopathy:    He has no cervical adenopathy.  Neurological: He is alert and oriented to person, place, and time. Coordination normal.  Skin: Skin is warm and dry. No rash noted. He is not diaphoretic.  Psychiatric: He has a normal mood and affect. His behavior is normal.  Nursing note and vitals reviewed.       Assessment & Plan:   Problem List Items Addressed This Visit      Cardiovascular and Mediastinum   Hypertension   Relevant Medications   metoprolol tartrate (LOPRESSOR) 25 MG tablet   valsartan-hydrochlorothiazide (DIOVAN-HCT) 80-12.5 MG tablet   fenofibrate (TRICOR) 145 MG tablet   atorvastatin (LIPITOR) 20 MG tablet   Other Relevant Orders   CMP14+EGFR   Lipid panel     Digestive   GERD (gastroesophageal reflux disease)   Relevant Medications   omeprazole (PRILOSEC) 20 MG capsule   Other Relevant Orders   CBC with Differential/Platelet     Other   Overweight (BMI 25.0-29.9) - Primary   Relevant Orders   Lipid panel   Prediabetes   Relevant Orders   Bayer DCA Hb A1c Waived   Lipid panel   Hyperlipidemia LDL goal <130   Relevant Medications   metoprolol tartrate (LOPRESSOR) 25 MG tablet   valsartan-hydrochlorothiazide (DIOVAN-HCT) 80-12.5 MG tablet   fenofibrate (TRICOR) 145 MG tablet   atorvastatin (LIPITOR) 20 MG tablet    Other Visit Diagnoses    Benign prostatic hyperplasia with incomplete bladder emptying       Relevant Medications    finasteride (PROSCAR) 5 MG tablet   Other Relevant Orders   PSA, total and free     Continue current medications, will do labs and see back in 6 months  Follow up plan: Return in about 6 months (around 10/18/2018), or if symptoms worsen or fail to improve, for Prediabetes and hypertension and cholesterol.  Counseling provided for all of the vaccine components Orders Placed This Encounter  Procedures  . Bayer DCA Hb A1c Waived  . CBC with Differential/Platelet  . CMP14+EGFR  . Lipid panel  . PSA, total and free    Caryl Pina, MD [facility] 04/17/2018, 8:11 AM

## 2018-04-18 LAB — CMP14+EGFR
ALT: 14 IU/L (ref 0–44)
AST: 22 IU/L (ref 0–40)
Albumin/Globulin Ratio: 1.3 (ref 1.2–2.2)
Albumin: 4.2 g/dL (ref 3.5–4.8)
Alkaline Phosphatase: 41 IU/L (ref 39–117)
BUN/Creatinine Ratio: 14 (ref 10–24)
BUN: 22 mg/dL (ref 8–27)
Bilirubin Total: 0.4 mg/dL (ref 0.0–1.2)
CO2: 23 mmol/L (ref 20–29)
Calcium: 9.6 mg/dL (ref 8.6–10.2)
Chloride: 101 mmol/L (ref 96–106)
Creatinine, Ser: 1.56 mg/dL — ABNORMAL HIGH (ref 0.76–1.27)
GFR calc Af Amer: 51 mL/min/{1.73_m2} — ABNORMAL LOW (ref >59)
GFR calc non Af Amer: 44 mL/min/{1.73_m2} — ABNORMAL LOW (ref >59)
Globulin, Total: 3.2 g/dL (ref 1.5–4.5)
Glucose: 103 mg/dL — ABNORMAL HIGH (ref 65–99)
Potassium: 4.4 mmol/L (ref 3.5–5.2)
Sodium: 139 mmol/L (ref 134–144)
Total Protein: 7.4 g/dL (ref 6.0–8.5)

## 2018-04-18 LAB — CBC WITH DIFFERENTIAL/PLATELET
Basophils Absolute: 0 10*3/uL (ref 0.0–0.2)
Basos: 0 %
EOS (ABSOLUTE): 0.1 10*3/uL (ref 0.0–0.4)
Eos: 1 %
Hematocrit: 43.8 % (ref 37.5–51.0)
Hemoglobin: 14.6 g/dL (ref 13.0–17.7)
Immature Grans (Abs): 0 10*3/uL (ref 0.0–0.1)
Immature Granulocytes: 0 %
Lymphocytes Absolute: 2.6 10*3/uL (ref 0.7–3.1)
Lymphs: 26 %
MCH: 30 pg (ref 26.6–33.0)
MCHC: 33.3 g/dL (ref 31.5–35.7)
MCV: 90 fL (ref 79–97)
Monocytes Absolute: 0.8 10*3/uL (ref 0.1–0.9)
Monocytes: 8 %
Neutrophils Absolute: 6.3 10*3/uL (ref 1.4–7.0)
Neutrophils: 65 %
Platelets: 292 10*3/uL (ref 150–450)
RBC: 4.87 x10E6/uL (ref 4.14–5.80)
RDW: 14.1 % (ref 12.3–15.4)
WBC: 9.8 10*3/uL (ref 3.4–10.8)

## 2018-04-18 LAB — LIPID PANEL
Chol/HDL Ratio: 2.1 ratio (ref 0.0–5.0)
Cholesterol, Total: 103 mg/dL (ref 100–199)
HDL: 48 mg/dL (ref >39)
LDL Calculated: 45 mg/dL (ref 0–99)
Triglycerides: 51 mg/dL (ref 0–149)
VLDL Cholesterol Cal: 10 mg/dL (ref 5–40)

## 2018-04-18 LAB — PSA, TOTAL AND FREE
PSA, Free Pct: 30 %
PSA, Free: 0.06 ng/mL
Prostate Specific Ag, Serum: 0.2 ng/mL (ref 0.0–4.0)

## 2018-04-20 ENCOUNTER — Other Ambulatory Visit: Payer: Self-pay | Admitting: *Deleted

## 2018-04-20 DIAGNOSIS — R7989 Other specified abnormal findings of blood chemistry: Secondary | ICD-10-CM

## 2018-05-20 ENCOUNTER — Telehealth: Payer: Self-pay | Admitting: Family Medicine

## 2019-03-31 ENCOUNTER — Other Ambulatory Visit: Payer: Self-pay | Admitting: Family Medicine

## 2019-04-23 ENCOUNTER — Other Ambulatory Visit: Payer: Self-pay | Admitting: Family Medicine

## 2019-05-03 ENCOUNTER — Other Ambulatory Visit: Payer: Self-pay | Admitting: Family Medicine

## 2019-05-04 ENCOUNTER — Other Ambulatory Visit: Payer: Self-pay | Admitting: Family Medicine

## 2019-05-10 ENCOUNTER — Other Ambulatory Visit: Payer: Self-pay | Admitting: Family Medicine

## 2019-05-18 ENCOUNTER — Other Ambulatory Visit: Payer: Self-pay

## 2019-05-19 ENCOUNTER — Encounter: Payer: Self-pay | Admitting: Family Medicine

## 2019-05-19 ENCOUNTER — Ambulatory Visit (INDEPENDENT_AMBULATORY_CARE_PROVIDER_SITE_OTHER): Payer: Medicare Other | Admitting: Family Medicine

## 2019-05-19 VITALS — BP 106/76 | HR 66 | Temp 97.3°F | Ht 71.0 in | Wt 218.0 lb

## 2019-05-19 DIAGNOSIS — I10 Essential (primary) hypertension: Secondary | ICD-10-CM | POA: Diagnosis not present

## 2019-05-19 DIAGNOSIS — K2100 Gastro-esophageal reflux disease with esophagitis, without bleeding: Secondary | ICD-10-CM

## 2019-05-19 DIAGNOSIS — N529 Male erectile dysfunction, unspecified: Secondary | ICD-10-CM

## 2019-05-19 DIAGNOSIS — R0602 Shortness of breath: Secondary | ICD-10-CM | POA: Diagnosis not present

## 2019-05-19 DIAGNOSIS — E785 Hyperlipidemia, unspecified: Secondary | ICD-10-CM

## 2019-05-19 DIAGNOSIS — R7303 Prediabetes: Secondary | ICD-10-CM | POA: Diagnosis not present

## 2019-05-19 MED ORDER — SILDENAFIL CITRATE 20 MG PO TABS: 20.0000 mg | ORAL_TABLET | ORAL | 1 refills | Status: DC | PRN

## 2019-05-19 MED ORDER — METOPROLOL TARTRATE 25 MG PO TABS: 25.0000 mg | ORAL_TABLET | Freq: Two times a day (BID) | ORAL | 3 refills | Status: DC

## 2019-05-19 MED ORDER — FENOFIBRATE 145 MG PO TABS: 145.0000 mg | ORAL_TABLET | Freq: Every day | ORAL | 3 refills | Status: DC

## 2019-05-19 MED ORDER — SPIRIVA HANDIHALER 18 MCG IN CAPS: 18.0000 ug | ORAL_CAPSULE | Freq: Every day | RESPIRATORY_TRACT | 12 refills | Status: DC

## 2019-05-19 MED ORDER — VALSARTAN 80 MG PO TABS: 80.0000 mg | ORAL_TABLET | Freq: Every day | ORAL | 3 refills | Status: DC

## 2019-05-19 MED ORDER — OMEPRAZOLE 20 MG PO CPDR: 20.0000 mg | DELAYED_RELEASE_CAPSULE | Freq: Every day | ORAL | 3 refills | Status: DC

## 2019-05-19 MED ORDER — FINASTERIDE 5 MG PO TABS: 5.0000 mg | ORAL_TABLET | Freq: Every day | ORAL | 3 refills | Status: DC

## 2019-05-19 MED ORDER — ATORVASTATIN CALCIUM 20 MG PO TABS: ORAL_TABLET | ORAL | 3 refills | Status: DC

## 2019-05-19 NOTE — Progress Notes (Signed)
BP 106/76   Pulse 66   Temp (!) 97.3 F (36.3 C) (Temporal)   Ht 5' 11"  (1.803 m)   Wt 218 lb (98.9 kg)   BMI 30.40 kg/m    Subjective:   Patient ID: Christopher Mata, male    DOB: 1946/08/30, 73 y.o.   MRN: [id number]  HPI: Christopher Mata is a 73 y.o. male presenting on 05/19/2019 for Medical Management of Chronic Issues (check up of chronic medical conditions)   HPI Prediabetes Patient comes in today for recheck of his diabetes. Patient has been currently taking no medication we are monitoring. Patient is currently on an ACE inhibitor/ARB. Patient has not seen an ophthalmologist this year. Patient denies any issues with their feet.  Patient complains of exertional shortness of breath that he is had over the past few years worse but he thinks it is been going on for 15 years.  He had an echocardiogram last year that was fine with mild grade 1 diastolic dysfunction but otherwise normal.  Patient denies any coughing or wheezing spells but just says especially in the summertime when it is hot and humid he gets more short of breath when he is outside easily.  Hypertension Patient is currently on valsartan-hydrochlorothiazide and metoprolol, and their blood pressure today is 106/76.  Patient gets occasional orthostatic dizziness. Patient denies headaches, blurred vision, chest pains, shortness of breath, or weakness. Denies any side effects from medication and is content with current medication.   Hyperlipidemia Patient is coming in for recheck of his hyperlipidemia. The patient is currently taking fenofibrate and Lipitor. They deny any issues with myalgias or history of liver damage from it. They deny any focal numbness or weakness or chest pain.   GERD Patient is currently on omeprazole.  She denies any major symptoms or abdominal pain or belching or burping. She denies any blood in her stool or lightheadedness or dizziness.  Patient has a history of hiatal hernia repair  Patient  complains of erectile dysfunction and wonders if he could try some Viagra.  Relevant past medical, surgical, family and social history reviewed and updated as indicated. Interim medical history since our last visit reviewed. Allergies and medications reviewed and updated.  Review of Systems  Constitutional: Negative for chills and fever.  Eyes: Negative for visual disturbance.  Respiratory: Positive for shortness of breath. Negative for cough and wheezing.   Cardiovascular: Negative for chest pain and leg swelling.  Musculoskeletal: Negative for back pain and gait problem.  Skin: Negative for rash.  Neurological: Negative for dizziness, weakness and light-headedness.  All other systems reviewed and are negative.   Per HPI unless specifically indicated above   Allergies as of 05/19/2019   No Known Allergies     Medication List       Accurate as of May 19, 2019  1:38 PM. If you have any questions, ask your nurse or doctor.        STOP taking these medications   valsartan-hydrochlorothiazide 80-12.5 MG tablet Commonly known as: DIOVAN-HCT Stopped by: Fransisca Kaufmann , MD     TAKE these medications   aspirin EC 81 MG tablet Take 81 mg by mouth daily.   atorvastatin 20 MG tablet Commonly known as: LIPITOR TAKE 1 TABLET BY MOUTH EVERYDAY AT BEDTIME   fenofibrate 145 MG tablet Commonly known as: TRICOR Take 1 tablet (145 mg total) by mouth daily.   finasteride 5 MG tablet Commonly known as: PROSCAR Take 1 tablet (5 mg total)  by mouth daily.   metoprolol tartrate 25 MG tablet Commonly known as: LOPRESSOR Take 1 tablet (25 mg total) by mouth 2 (two) times daily. What changed: additional instructions Changed by: Fransisca Kaufmann , MD   omeprazole 20 MG capsule Commonly known as: PRILOSEC Take 1 capsule (20 mg total) by mouth daily. (Needs to be seen before next refill)   sildenafil 20 MG tablet Commonly known as: REVATIO Take 1-3 tablets (20-60 mg total) by  mouth as needed. Started by: Fransisca Kaufmann , MD   valsartan 80 MG tablet Commonly known as: Diovan Take 1 tablet (80 mg total) by mouth daily. Started by: Fransisca Kaufmann , MD        Objective:   BP 106/76   Pulse 66   Temp (!) 97.3 F (36.3 C) (Temporal)   Ht 5' 11"  (1.803 m)   Wt 218 lb (98.9 kg)   BMI 30.40 kg/m   Wt Readings from Last 3 Encounters:  05/19/19 218 lb (98.9 kg)  04/17/18 215 lb (97.5 kg)  05/23/17 201 lb (91.2 kg)    Physical Exam Vitals signs and nursing note reviewed.  Constitutional:      General: He is not in acute distress.    Appearance: He is well-developed. He is not diaphoretic.  Eyes:     General: No scleral icterus.    Conjunctiva/sclera: Conjunctivae normal.  Neck:     Musculoskeletal: Neck supple.     Thyroid: No thyromegaly.  Cardiovascular:     Rate and Rhythm: Normal rate and regular rhythm.     Heart sounds: Normal heart sounds. No murmur.  Pulmonary:     Effort: Pulmonary effort is normal. No respiratory distress.     Breath sounds: Normal breath sounds. No wheezing.  Musculoskeletal: Normal range of motion.  Lymphadenopathy:     Cervical: No cervical adenopathy.  Skin:    General: Skin is warm and dry.     Findings: No rash.  Neurological:     Mental Status: He is alert and oriented to person, place, and time.     Coordination: Coordination normal.  Psychiatric:        Behavior: Behavior normal.       Assessment & Plan:   Problem List Items Addressed This Visit      Cardiovascular and Mediastinum   Hypertension   Relevant Medications   atorvastatin (LIPITOR) 20 MG tablet   fenofibrate (TRICOR) 145 MG tablet   metoprolol tartrate (LOPRESSOR) 25 MG tablet   valsartan (DIOVAN) 80 MG tablet   sildenafil (REVATIO) 20 MG tablet   Other Relevant Orders   CMP14+EGFR     Digestive   GERD (gastroesophageal reflux disease)   Relevant Medications   omeprazole (PRILOSEC) 20 MG capsule   Other Relevant Orders    CBC with Differential/Platelet     Other   Prediabetes - Primary   Relevant Orders   Bayer DCA Hb A1c Waived   Hyperlipidemia LDL goal <130   Relevant Medications   atorvastatin (LIPITOR) 20 MG tablet   fenofibrate (TRICOR) 145 MG tablet   metoprolol tartrate (LOPRESSOR) 25 MG tablet   valsartan (DIOVAN) 80 MG tablet   sildenafil (REVATIO) 20 MG tablet   Other Relevant Orders   Lipid panel   ED (erectile dysfunction)    Other Visit Diagnoses    Exertional shortness of breath       Relevant Medications   tiotropium (SPIRIVA HANDIHALER) 18 MCG inhalation capsule   Other Relevant Orders  PR BREATHING CAPACITY TEST      Possibly COPD with exertional dyspnea, will do a breathing test  Lower his blood pressure medication by cutting out the hydrochlorothiazide and keeping just the valsartan.  Patient can try generic Viagra for ED  Continue metoprolol and atorvastatin and fenofibrate and omeprazole  Consider starting Spiriva or Incruse based on spirometry but cannot do spirometry because of coronavirus and will hold off to do that in the future but will try Spiriva for now. Follow up plan: Return in about 6 months (around 11/19/2019), or if symptoms worsen or fail to improve, for Hypertension and prediabetes follow-up.  Counseling provided for all of the vaccine components Orders Placed This Encounter  Procedures  . Bayer DCA Hb A1c Waived  . CBC with Differential/Platelet  . CMP14+EGFR  . Lipid panel  . PR BREATHING CAPACITY TEST    Caryl Pina, MD [facility] 05/19/2019, 1:38 PM

## 2019-06-25 ENCOUNTER — Ambulatory Visit: Payer: Medicare Other | Admitting: Family Medicine

## 2019-07-05 ENCOUNTER — Ambulatory Visit (INDEPENDENT_AMBULATORY_CARE_PROVIDER_SITE_OTHER): Payer: Medicare Other | Admitting: Family Medicine

## 2019-07-05 ENCOUNTER — Encounter: Payer: Self-pay | Admitting: Family Medicine

## 2019-07-05 DIAGNOSIS — J449 Chronic obstructive pulmonary disease, unspecified: Secondary | ICD-10-CM | POA: Diagnosis not present

## 2019-07-05 NOTE — Progress Notes (Signed)
Virtual Visit via telephone Note  I connected with Christopher Mata on 07/05/19 at [phone] by telephone and verified that I am speaking with the correct person using two identifiers. Christopher Mata is currently located at home and no other people are currently with her during visit. The provider, Fransisca Kaufmann Aftyn Nott, MD is located in their office at time of visit.  Call ended at [phone]  I discussed the limitations, risks, security and privacy concerns of performing an evaluation and management service by telephone and the availability of in person appointments. I also discussed with the patient that there may be a patient responsible charge related to this service. The patient expressed understanding and agreed to proceed.   History and Present Illness: Patient is calling in to discuss Spiriva and was taking it for 35 days and felt no benefit and stopped it last week. He felt like his cough was worse on the spiriva. Patient still has a dry cough and some difficulty breathing and wheezing. Patient denies fevers or chills or nausea or vomiting  No diagnosis found.  Outpatient Encounter Medications as of 07/05/2019  Medication Sig   aspirin EC 81 MG tablet Take 81 mg by mouth daily.   atorvastatin (LIPITOR) 20 MG tablet TAKE 1 TABLET BY MOUTH EVERYDAY AT BEDTIME   fenofibrate (TRICOR) 145 MG tablet Take 1 tablet (145 mg total) by mouth daily.   finasteride (PROSCAR) 5 MG tablet Take 1 tablet (5 mg total) by mouth daily.   metoprolol tartrate (LOPRESSOR) 25 MG tablet Take 1 tablet (25 mg total) by mouth 2 (two) times daily.   omeprazole (PRILOSEC) 20 MG capsule Take 1 capsule (20 mg total) by mouth daily. (Needs to be seen before next refill)   sildenafil (REVATIO) 20 MG tablet Take 1-3 tablets (20-60 mg total) by mouth as needed.   tiotropium (SPIRIVA HANDIHALER) 18 MCG inhalation capsule Place 1 capsule (18 mcg total) into inhaler and inhale daily.   valsartan (DIOVAN) 80 MG tablet Take 1  tablet (80 mg total) by mouth daily.   No facility-administered encounter medications on file as of 07/05/2019.     Review of Systems  Constitutional: Negative for chills and fever.  Respiratory: Positive for cough, shortness of breath and wheezing.   Cardiovascular: Negative for chest pain and leg swelling.  Musculoskeletal: Negative for back pain and gait problem.  Skin: Negative for rash.  Neurological: Negative for dizziness, weakness and light-headedness.  All other systems reviewed and are negative.   Observations/Objective: Patient sounds comfortable and in no acute distress  Assessment and Plan: Problem List Items Addressed This Visit    None    Visit Diagnoses    Chronic obstructive pulmonary disease, unspecified COPD type ([place])    -  Primary   Relevant Orders   Pulmonary function test      Will have patient stop Spiriva and recommended for him to go get pulmonary function test, we cannot do an office anymore because of the COVID limitations. Follow Up Instructions:  Patient sounds comfortable and in no acute distress   I discussed the assessment and treatment plan with the patient. The patient was provided an opportunity to ask questions and all were answered. The patient agreed with the plan and demonstrated an understanding of the instructions.   The patient was advised to call back or seek an in-person evaluation if the symptoms worsen or if the condition fails to improve as anticipated.  The above assessment and management plan was  discussed with the patient. The patient verbalized understanding of and has agreed to the management plan. Patient is aware to call the clinic if symptoms persist or worsen. Patient is aware when to return to the clinic for a follow-up visit. Patient educated on when it is appropriate to go to the emergency department.    I provided 13 minutes of non-face-to-face time during this encounter.    Nils Pyle, MD

## 2019-07-12 ENCOUNTER — Telehealth: Payer: Self-pay | Admitting: Family Medicine

## 2019-07-12 NOTE — Telephone Encounter (Signed)
Aware.  Call [phone] at [facility] to ask about scheduling.

## 2019-07-12 NOTE — Telephone Encounter (Signed)
I did put the order in for pulmonary function test, I do not necessarily know how to put it in to be done at the hospital but the order is in for a pulmonary function test in my last note.

## 2019-07-23 ENCOUNTER — Other Ambulatory Visit: Payer: Self-pay

## 2019-07-23 ENCOUNTER — Other Ambulatory Visit: Payer: Self-pay | Admitting: Family Medicine

## 2019-07-23 ENCOUNTER — Telehealth: Payer: Self-pay

## 2019-07-23 ENCOUNTER — Other Ambulatory Visit: Payer: Self-pay | Admitting: *Deleted

## 2019-07-23 DIAGNOSIS — Z20822 Contact with and (suspected) exposure to covid-19: Secondary | ICD-10-CM

## 2019-07-23 DIAGNOSIS — U071 COVID-19: Secondary | ICD-10-CM

## 2019-07-23 NOTE — Addendum Note (Signed)
Addended by: Marvene Staff on: 07/23/2019 12:24 PM   Modules accepted: Orders

## 2019-07-23 NOTE — Telephone Encounter (Signed)
Needed a order

## 2019-07-24 LAB — NOVEL CORONAVIRUS, NAA: SARS-CoV-2, NAA: NOT DETECTED

## 2019-07-26 ENCOUNTER — Telehealth: Payer: Self-pay | Admitting: Family Medicine

## 2019-07-26 ENCOUNTER — Inpatient Hospital Stay (HOSPITAL_COMMUNITY): Admission: RE | Admit: 2019-07-26 | Payer: Medicare Other | Source: Ambulatory Visit

## 2019-07-26 NOTE — Telephone Encounter (Signed)
Patient had Covid test at [facility] and thought his PFT was today at [facility] as well but it was scheduled at [facility]. Doesn't want to go there. Wants to go to [facility]

## 2019-07-27 ENCOUNTER — Telehealth: Payer: Self-pay | Admitting: Family Medicine

## 2019-07-27 NOTE — Telephone Encounter (Signed)
Patient is scheduled for [facility] on 08/19/2019

## 2019-08-19 ENCOUNTER — Encounter (HOSPITAL_COMMUNITY): Payer: Medicare Other

## 2020-03-23 ENCOUNTER — Other Ambulatory Visit: Payer: Self-pay

## 2020-03-23 ENCOUNTER — Encounter: Payer: Self-pay | Admitting: Family Medicine

## 2020-03-23 ENCOUNTER — Ambulatory Visit (INDEPENDENT_AMBULATORY_CARE_PROVIDER_SITE_OTHER): Payer: Medicare Other

## 2020-03-23 ENCOUNTER — Ambulatory Visit (INDEPENDENT_AMBULATORY_CARE_PROVIDER_SITE_OTHER): Payer: Medicare Other | Admitting: Family Medicine

## 2020-03-23 VITALS — BP 135/86 | HR 68 | Temp 97.8°F | Ht 71.0 in | Wt 217.4 lb

## 2020-03-23 DIAGNOSIS — T781XXA Other adverse food reactions, not elsewhere classified, initial encounter: Secondary | ICD-10-CM

## 2020-03-23 DIAGNOSIS — J449 Chronic obstructive pulmonary disease, unspecified: Secondary | ICD-10-CM

## 2020-03-23 MED ORDER — DULERA 100-5 MCG/ACT IN AERO: 2.0000 | INHALATION_SPRAY | Freq: Two times a day (BID) | RESPIRATORY_TRACT | 2 refills | Status: DC

## 2020-03-23 NOTE — Progress Notes (Signed)
BP 135/86   Pulse 68   Temp 97.8 F (36.6 C) (Temporal)   Ht 5' 11"  (1.803 m)   Wt 217 lb 6.4 oz (98.6 kg)   SpO2 95%   BMI 30.32 kg/m    Subjective:   Patient ID: Christopher Mata, male    DOB: [birth year], 74 y.o.   MRN: [id number]  HPI: Christopher Mata is a 74 y.o. male presenting on 03/23/2020 for COPD (patient has taken spiriva with no relife it makes him cough.) and Tick Removal (wants tested for alpha-gal. When eats red meat he breaks out and itches. Pt has found 2 ticks. lips swelling)   HPI Tick bite Patient has found 2 ticks and is having lip swelling when he eats red meat that he was noticed last September.  He was diagnosed with alpha gal but he feels like it is getting better and wants to test for it.  He has had this since that time.  Patient has been diagnosed with some mild COPD and he says he still has a wheezing still having some breathing issues, he tried the Spiriva but just felt it made him cough more and did not help and would like to see if he could try something different.  He has not had the breathing test a lot of facilities or not doing it because of the Covid.  Relevant past medical, surgical, family and social history reviewed and updated as indicated. Interim medical history since our last visit reviewed. Allergies and medications reviewed and updated.  Review of Systems  Constitutional: Negative for chills and fever.  Respiratory: Positive for cough and wheezing. Negative for shortness of breath.   Cardiovascular: Negative for chest pain and leg swelling.  Musculoskeletal: Negative for back pain and gait problem.  Skin: Negative for rash.  All other systems reviewed and are negative.   Per HPI unless specifically indicated above   Allergies as of 03/23/2020   No Known Allergies     Medication List       Accurate as of March 23, 2020 11:27 AM. If you have any questions, ask your nurse or doctor.        aspirin EC 81 MG tablet Take 81 mg  by mouth daily.   atorvastatin 20 MG tablet Commonly known as: LIPITOR TAKE 1 TABLET BY MOUTH EVERYDAY AT BEDTIME   Dulera 100-5 MCG/ACT Aero Generic drug: mometasone-formoterol Inhale 2 puffs into the lungs in the morning and at bedtime. Started by: Worthy Rancher, MD   fenofibrate 145 MG tablet Commonly known as: TRICOR Take 1 tablet (145 mg total) by mouth daily.   finasteride 5 MG tablet Commonly known as: PROSCAR Take 1 tablet (5 mg total) by mouth daily.   metoprolol tartrate 25 MG tablet Commonly known as: LOPRESSOR Take 1 tablet (25 mg total) by mouth 2 (two) times daily.   omeprazole 20 MG capsule Commonly known as: PRILOSEC Take 1 capsule (20 mg total) by mouth daily. (Needs to be seen before next refill)   sildenafil 20 MG tablet Commonly known as: REVATIO Take 1-3 tablets (20-60 mg total) by mouth as needed.   valsartan 80 MG tablet Commonly known as: Diovan Take 1 tablet (80 mg total) by mouth daily.        Objective:   BP 135/86   Pulse 68   Temp 97.8 F (36.6 C) (Temporal)   Ht 5' 11"  (1.803 m)   Wt 217 lb 6.4 oz (98.6 kg)  SpO2 95%   BMI 30.32 kg/m   Wt Readings from Last 3 Encounters:  03/23/20 217 lb 6.4 oz (98.6 kg)  05/19/19 218 lb (98.9 kg)  04/17/18 215 lb (97.5 kg)    Physical Exam Vitals and nursing note reviewed.  Constitutional:      General: He is not in acute distress.    Appearance: He is well-developed. He is not diaphoretic.  Eyes:     General: No scleral icterus.    Conjunctiva/sclera: Conjunctivae normal.  Neck:     Thyroid: No thyromegaly.  Cardiovascular:     Rate and Rhythm: Normal rate and regular rhythm.     Heart sounds: Normal heart sounds. No murmur heard.   Pulmonary:     Effort: Pulmonary effort is normal. No respiratory distress.     Breath sounds: Normal breath sounds. No wheezing.  Musculoskeletal:        General: Normal range of motion.     Cervical back: Neck supple.  Lymphadenopathy:      Cervical: No cervical adenopathy.  Skin:    General: Skin is warm and dry.     Findings: No rash.  Neurological:     Mental Status: He is alert and oriented to person, place, and time.     Coordination: Coordination normal.  Psychiatric:        Behavior: Behavior normal.       Assessment & Plan:   Problem List Items Addressed This Visit    None    Visit Diagnoses    Chronic obstructive pulmonary disease, unspecified COPD type ([place])    -  Primary   Relevant Medications   mometasone-formoterol (DULERA) 100-5 MCG/ACT AERO   Other Relevant Orders   DG Chest 2 View   Alpha-Gal Panel   CMP14+EGFR   Allergic reaction to alpha-gal       Relevant Orders   Alpha-Gal Panel   CMP14+EGFR      Gave sample for Dulera to use twice a day and if does well then will send a prescription in. Follow up plan: Return if symptoms worsen or fail to improve.  Counseling provided for all of the vaccine components Orders Placed This Encounter  Procedures  . DG Chest 2 View  . Alpha-Gal Panel  . [place] Trichelle Lehan, MD [facility] 03/23/2020, 11:27 AM

## 2020-03-24 ENCOUNTER — Telehealth: Payer: Self-pay | Admitting: Family Medicine

## 2020-03-24 DIAGNOSIS — J8410 Pulmonary fibrosis, unspecified: Secondary | ICD-10-CM

## 2020-03-24 NOTE — Telephone Encounter (Signed)
Patient was contacted about interstitial lung markings on CXR, informed him that we were doing referral.  Arville Care, MD [facility] 03/24/2020, 5:15 PM

## 2020-03-28 LAB — CMP14+EGFR
ALT: 12 IU/L (ref 0–44)
AST: 15 IU/L (ref 0–40)
Albumin/Globulin Ratio: 1.1 — ABNORMAL LOW (ref 1.2–2.2)
Albumin: 3.8 g/dL (ref 3.7–4.7)
Alkaline Phosphatase: 52 IU/L (ref 48–121)
BUN/Creatinine Ratio: 12 (ref 10–24)
BUN: 16 mg/dL (ref 8–27)
Bilirubin Total: 0.3 mg/dL (ref 0.0–1.2)
CO2: 21 mmol/L (ref 20–29)
Calcium: 9.3 mg/dL (ref 8.6–10.2)
Chloride: 106 mmol/L (ref 96–106)
Creatinine, Ser: 1.33 mg/dL — ABNORMAL HIGH (ref 0.76–1.27)
GFR calc Af Amer: 61 mL/min/{1.73_m2} (ref >59)
GFR calc non Af Amer: 53 mL/min/{1.73_m2} — ABNORMAL LOW (ref >59)
Globulin, Total: 3.5 g/dL (ref 1.5–4.5)
Glucose: 101 mg/dL — ABNORMAL HIGH (ref 65–99)
Potassium: 4.5 mmol/L (ref 3.5–5.2)
Sodium: 140 mmol/L (ref 134–144)
Total Protein: 7.3 g/dL (ref 6.0–8.5)

## 2020-03-28 LAB — ALPHA-GAL PANEL
Alpha Gal IgE*: 0.92 kU/L — ABNORMAL HIGH (ref <0.10)
Beef (Bos spp) IgE: 0.15 kU/L (ref <0.35)
Class Interpretation: 0
Class Interpretation: 0
Lamb/Mutton (Ovis spp) IgE: <0.1 kU/L (ref <0.35)
Pork (Sus spp) IgE: <0.1 kU/L (ref <0.35)

## 2020-04-03 ENCOUNTER — Telehealth: Payer: Self-pay | Admitting: Family Medicine

## 2020-04-03 NOTE — Telephone Encounter (Signed)
Pt states that the pulmonologist has not called to schedule an appt with him. He states he was to let Dr. Louanne Skye know today if he had not heard anything.

## 2020-04-03 NOTE — Telephone Encounter (Signed)
I called pt and explained that it looks like referral is in the works and he should receive a call soon

## 2020-04-17 ENCOUNTER — Emergency Department (HOSPITAL_COMMUNITY): Payer: Medicare Other

## 2020-04-17 ENCOUNTER — Encounter (HOSPITAL_COMMUNITY): Payer: Self-pay

## 2020-04-17 ENCOUNTER — Observation Stay (HOSPITAL_COMMUNITY)
Admission: EM | Admit: 2020-04-17 | Discharge: 2020-04-18 | Disposition: A | Discharge status: Home / Self Care | Payer: Medicare Other | Attending: Family Medicine | Admitting: Family Medicine

## 2020-04-17 ENCOUNTER — Observation Stay (HOSPITAL_COMMUNITY): Payer: Medicare Other

## 2020-04-17 ENCOUNTER — Other Ambulatory Visit: Payer: Self-pay

## 2020-04-17 DIAGNOSIS — Z79899 Other long term (current) drug therapy: Secondary | ICD-10-CM | POA: Diagnosis not present

## 2020-04-17 DIAGNOSIS — Z7982 Long term (current) use of aspirin: Secondary | ICD-10-CM | POA: Diagnosis not present

## 2020-04-17 DIAGNOSIS — I959 Hypotension, unspecified: Secondary | ICD-10-CM | POA: Diagnosis not present

## 2020-04-17 DIAGNOSIS — Y939 Activity, unspecified: Secondary | ICD-10-CM | POA: Diagnosis not present

## 2020-04-17 DIAGNOSIS — T7803XA Anaphylactic reaction due to other fish, initial encounter: Secondary | ICD-10-CM | POA: Diagnosis not present

## 2020-04-17 DIAGNOSIS — T782XXA Anaphylactic shock, unspecified, initial encounter: Secondary | ICD-10-CM

## 2020-04-17 DIAGNOSIS — N179 Acute kidney failure, unspecified: Secondary | ICD-10-CM | POA: Diagnosis not present

## 2020-04-17 DIAGNOSIS — Z743 Need for continuous supervision: Secondary | ICD-10-CM | POA: Diagnosis not present

## 2020-04-17 DIAGNOSIS — Y999 Unspecified external cause status: Secondary | ICD-10-CM | POA: Diagnosis not present

## 2020-04-17 DIAGNOSIS — Z87891 Personal history of nicotine dependence: Secondary | ICD-10-CM | POA: Insufficient documentation

## 2020-04-17 DIAGNOSIS — R55 Syncope and collapse: Secondary | ICD-10-CM | POA: Diagnosis not present

## 2020-04-17 DIAGNOSIS — J84112 Idiopathic pulmonary fibrosis: Secondary | ICD-10-CM | POA: Diagnosis present

## 2020-04-17 DIAGNOSIS — R404 Transient alteration of awareness: Secondary | ICD-10-CM | POA: Diagnosis not present

## 2020-04-17 DIAGNOSIS — Y9289 Other specified places as the place of occurrence of the external cause: Secondary | ICD-10-CM | POA: Insufficient documentation

## 2020-04-17 DIAGNOSIS — I10 Essential (primary) hypertension: Secondary | ICD-10-CM | POA: Diagnosis not present

## 2020-04-17 DIAGNOSIS — S199XXA Unspecified injury of neck, initial encounter: Secondary | ICD-10-CM | POA: Diagnosis not present

## 2020-04-17 DIAGNOSIS — W1830XA Fall on same level, unspecified, initial encounter: Secondary | ICD-10-CM | POA: Insufficient documentation

## 2020-04-17 DIAGNOSIS — R0789 Other chest pain: Secondary | ICD-10-CM | POA: Diagnosis not present

## 2020-04-17 DIAGNOSIS — Z20822 Contact with and (suspected) exposure to covid-19: Secondary | ICD-10-CM | POA: Insufficient documentation

## 2020-04-17 DIAGNOSIS — L299 Pruritus, unspecified: Secondary | ICD-10-CM | POA: Diagnosis not present

## 2020-04-17 DIAGNOSIS — T781XXA Other adverse food reactions, not elsewhere classified, initial encounter: Secondary | ICD-10-CM | POA: Diagnosis present

## 2020-04-17 DIAGNOSIS — J849 Interstitial pulmonary disease, unspecified: Secondary | ICD-10-CM | POA: Diagnosis not present

## 2020-04-17 DIAGNOSIS — S2241XA Multiple fractures of ribs, right side, initial encounter for closed fracture: Secondary | ICD-10-CM | POA: Diagnosis not present

## 2020-04-17 DIAGNOSIS — T7840XA Allergy, unspecified, initial encounter: Secondary | ICD-10-CM | POA: Diagnosis present

## 2020-04-17 DIAGNOSIS — R4020 Unspecified coma: Secondary | ICD-10-CM | POA: Diagnosis not present

## 2020-04-17 DIAGNOSIS — S2231XA Fracture of one rib, right side, initial encounter for closed fracture: Secondary | ICD-10-CM | POA: Diagnosis not present

## 2020-04-17 DIAGNOSIS — I6523 Occlusion and stenosis of bilateral carotid arteries: Secondary | ICD-10-CM | POA: Diagnosis not present

## 2020-04-17 HISTORY — DX: Allergy to other foods: Z91.018

## 2020-04-17 LAB — LACTIC ACID, PLASMA
Lactic Acid, Venous: 2 mmol/L (ref 0.5–1.9)
Lactic Acid, Venous: 1.5 mmol/L (ref 0.5–1.9)

## 2020-04-17 LAB — BASIC METABOLIC PANEL
Anion gap: 9 (ref 5–15)
BUN: 27 mg/dL — ABNORMAL HIGH (ref 8–23)
CO2: 19 mmol/L — ABNORMAL LOW (ref 22–32)
Calcium: 8.3 mg/dL — ABNORMAL LOW (ref 8.9–10.3)
Chloride: 110 mmol/L (ref 98–111)
Creatinine, Ser: 2.89 mg/dL — ABNORMAL HIGH (ref 0.61–1.24)
GFR calc Af Amer: 24 mL/min — ABNORMAL LOW (ref >60)
GFR calc non Af Amer: 21 mL/min — ABNORMAL LOW (ref >60)
Glucose, Bld: 152 mg/dL — ABNORMAL HIGH (ref 70–99)
Potassium: 4.3 mmol/L (ref 3.5–5.1)
Sodium: 138 mmol/L (ref 135–145)

## 2020-04-17 LAB — CBC WITH DIFFERENTIAL/PLATELET
Abs Immature Granulocytes: 0.13 10*3/uL — ABNORMAL HIGH (ref 0.00–0.07)
Basophils Absolute: 0.1 10*3/uL (ref 0.0–0.1)
Basophils Relative: 0 %
Eosinophils Absolute: 0 10*3/uL (ref 0.0–0.5)
Eosinophils Relative: 0 %
HCT: 53.9 % — ABNORMAL HIGH (ref 39.0–52.0)
Hemoglobin: 17.5 g/dL — ABNORMAL HIGH (ref 13.0–17.0)
Immature Granulocytes: 1 %
Lymphocytes Relative: 10 %
Lymphs Abs: 1.9 10*3/uL (ref 0.7–4.0)
MCH: 30.2 pg (ref 26.0–34.0)
MCHC: 32.5 g/dL (ref 30.0–36.0)
MCV: 92.9 fL (ref 80.0–100.0)
Monocytes Absolute: 1.2 10*3/uL — ABNORMAL HIGH (ref 0.1–1.0)
Monocytes Relative: 6 %
Neutro Abs: 16.2 10*3/uL — ABNORMAL HIGH (ref 1.7–7.7)
Neutrophils Relative %: 83 %
Platelets: 280 10*3/uL (ref 150–400)
RBC: 5.8 MIL/uL (ref 4.22–5.81)
RDW: 13 % (ref 11.5–15.5)
WBC: 19.6 10*3/uL — ABNORMAL HIGH (ref 4.0–10.5)
nRBC: 0 % (ref 0.0–0.2)

## 2020-04-17 LAB — HEMOGLOBIN A1C
Hgb A1c MFr Bld: 6.1 % — ABNORMAL HIGH (ref 4.8–5.6)
Mean Plasma Glucose: 128.37 mg/dL

## 2020-04-17 LAB — URINALYSIS, ROUTINE W REFLEX MICROSCOPIC
Glucose, UA: NEGATIVE mg/dL
Ketones, ur: NEGATIVE mg/dL
Leukocytes,Ua: NEGATIVE
Nitrite: NEGATIVE
Protein, ur: 30 mg/dL — AB
Specific Gravity, Urine: 1.019 (ref 1.005–1.030)
pH: 5 (ref 5.0–8.0)

## 2020-04-17 LAB — SARS CORONAVIRUS 2 BY RT PCR (HOSPITAL ORDER, PERFORMED IN ~~LOC~~ HOSPITAL LAB): SARS Coronavirus 2: NEGATIVE

## 2020-04-17 LAB — TROPONIN I (HIGH SENSITIVITY)
Troponin I (High Sensitivity): 5 ng/L (ref <18)
Troponin I (High Sensitivity): 4 ng/L

## 2020-04-17 LAB — GLUCOSE, CAPILLARY
Glucose-Capillary: 184 mg/dL — ABNORMAL HIGH (ref 70–99)
Glucose-Capillary: 205 mg/dL — ABNORMAL HIGH (ref 70–99)

## 2020-04-17 LAB — D-DIMER, QUANTITATIVE: D-Dimer, Quant: 18.65 ug/mL-FEU — ABNORMAL HIGH (ref 0.00–0.50)

## 2020-04-17 MED ORDER — MORPHINE SULFATE (PF) 2 MG/ML IV SOLN: 2.0000 mg | INTRAVENOUS | Status: DC | PRN

## 2020-04-17 MED ORDER — OXYCODONE HCL 5 MG PO TABS: 5.0000 mg | ORAL_TABLET | ORAL | Status: DC | PRN

## 2020-04-17 MED ORDER — INSULIN ASPART 100 UNIT/ML ~~LOC~~ SOLN
0.0000 [IU] | Freq: Three times a day (TID) | SUBCUTANEOUS | Status: DC
  Administered 2020-04-18: 1 [IU] via SUBCUTANEOUS

## 2020-04-17 MED ORDER — DIPHENHYDRAMINE HCL 50 MG/ML IJ SOLN
25.0000 mg | Freq: Two times a day (BID) | INTRAMUSCULAR | Status: DC
  Administered 2020-04-17: 25 mg via INTRAVENOUS
  Filled 2020-04-17 (×2): qty 1

## 2020-04-17 MED ORDER — FINASTERIDE 5 MG PO TABS
5.0000 mg | ORAL_TABLET | Freq: Every day | ORAL | Status: DC
  Administered 2020-04-17 – 2020-04-18 (×2): 5 mg via ORAL
  Filled 2020-04-17 (×2): qty 1

## 2020-04-17 MED ORDER — METOPROLOL TARTRATE 25 MG PO TABS
25.0000 mg | ORAL_TABLET | Freq: Two times a day (BID) | ORAL | Status: DC
  Administered 2020-04-17 – 2020-04-18 (×2): 25 mg via ORAL
  Filled 2020-04-17 (×2): qty 1

## 2020-04-17 MED ORDER — SODIUM CHLORIDE 0.9 % IV SOLN: INTRAVENOUS | Status: DC

## 2020-04-17 MED ORDER — DIPHENHYDRAMINE HCL 50 MG/ML IJ SOLN: 25.0000 mg | Freq: Two times a day (BID) | INTRAMUSCULAR | Status: DC

## 2020-04-17 MED ORDER — SODIUM CHLORIDE 0.9% FLUSH
3.0000 mL | Freq: Two times a day (BID) | INTRAVENOUS | Status: DC
  Administered 2020-04-18: 3 mL via INTRAVENOUS

## 2020-04-17 MED ORDER — METHYLPREDNISOLONE SODIUM SUCC 40 MG IJ SOLR
40.0000 mg | Freq: Four times a day (QID) | INTRAMUSCULAR | Status: DC
  Administered 2020-04-17: 40 mg via INTRAVENOUS
  Filled 2020-04-17: qty 1

## 2020-04-17 MED ORDER — SODIUM CHLORIDE 0.9% FLUSH: 3.0000 mL | INTRAVENOUS | Status: DC | PRN

## 2020-04-17 MED ORDER — FAMOTIDINE IN NACL 20-0.9 MG/50ML-% IV SOLN
20.0000 mg | INTRAVENOUS | Status: DC
  Administered 2020-04-17: 20 mg via INTRAVENOUS
  Filled 2020-04-17: qty 50

## 2020-04-17 MED ORDER — EPINEPHRINE 0.3 MG/0.3ML IJ SOAJ
0.3000 mg | Freq: Once | INTRAMUSCULAR | Status: AC
  Administered 2020-04-17: 0.3 mg via INTRAMUSCULAR
  Filled 2020-04-17: qty 0.3

## 2020-04-17 MED ORDER — ATORVASTATIN CALCIUM 40 MG PO TABS
40.0000 mg | ORAL_TABLET | Freq: Every day | ORAL | Status: DC
  Administered 2020-04-17 – 2020-04-18 (×2): 40 mg via ORAL
  Filled 2020-04-17 (×2): qty 1

## 2020-04-17 MED ORDER — MOMETASONE FURO-FORMOTEROL FUM 100-5 MCG/ACT IN AERO
2.0000 | INHALATION_SPRAY | Freq: Two times a day (BID) | RESPIRATORY_TRACT | Status: DC
  Filled 2020-04-17: qty 8.8

## 2020-04-17 MED ORDER — LACTATED RINGERS IV BOLUS
1000.0000 mL | Freq: Once | INTRAVENOUS | Status: AC
  Administered 2020-04-17: 1000 mL via INTRAVENOUS

## 2020-04-17 MED ORDER — TRAZODONE HCL 50 MG PO TABS: 50.0000 mg | ORAL_TABLET | Freq: Every evening | ORAL | Status: DC | PRN

## 2020-04-17 MED ORDER — ONDANSETRON HCL 4 MG/2ML IJ SOLN: 4.0000 mg | Freq: Four times a day (QID) | INTRAMUSCULAR | Status: DC | PRN

## 2020-04-17 MED ORDER — HEPARIN SODIUM (PORCINE) 5000 UNIT/ML IJ SOLN
5000.0000 [IU] | Freq: Three times a day (TID) | INTRAMUSCULAR | Status: DC
  Administered 2020-04-17: 5000 [IU] via SUBCUTANEOUS
  Filled 2020-04-17 (×2): qty 1

## 2020-04-17 MED ORDER — FAMOTIDINE IN NACL 20-0.9 MG/50ML-% IV SOLN: 20.0000 mg | Freq: Two times a day (BID) | INTRAVENOUS | Status: DC

## 2020-04-17 MED ORDER — ALBUTEROL SULFATE (2.5 MG/3ML) 0.083% IN NEBU: 2.5000 mg | INHALATION_SOLUTION | RESPIRATORY_TRACT | Status: DC | PRN

## 2020-04-17 MED ORDER — NICOTINE 14 MG/24HR TD PT24
14.0000 mg | MEDICATED_PATCH | Freq: Every day | TRANSDERMAL | Status: DC
  Administered 2020-04-17 – 2020-04-18 (×2): 14 mg via TRANSDERMAL
  Filled 2020-04-17 (×2): qty 1

## 2020-04-17 MED ORDER — FENTANYL CITRATE (PF) 100 MCG/2ML IJ SOLN
50.0000 ug | Freq: Once | INTRAMUSCULAR | Status: AC
  Administered 2020-04-17: 50 ug via INTRAVENOUS
  Filled 2020-04-17: qty 2

## 2020-04-17 MED ORDER — METHYLPREDNISOLONE SODIUM SUCC 40 MG IJ SOLR
80.0000 mg | Freq: Once | INTRAMUSCULAR | Status: AC
  Administered 2020-04-17: 80 mg via INTRAVENOUS
  Filled 2020-04-17: qty 2

## 2020-04-17 MED ORDER — PANTOPRAZOLE SODIUM 40 MG PO TBEC
40.0000 mg | DELAYED_RELEASE_TABLET | Freq: Every day | ORAL | Status: DC
  Administered 2020-04-17 – 2020-04-18 (×2): 40 mg via ORAL
  Filled 2020-04-17 (×2): qty 1

## 2020-04-17 MED ORDER — SODIUM CHLORIDE 0.9 % IV BOLUS
1000.0000 mL | Freq: Once | INTRAVENOUS | Status: AC
  Administered 2020-04-17: 1000 mL via INTRAVENOUS

## 2020-04-17 MED ORDER — INSULIN ASPART 100 UNIT/ML ~~LOC~~ SOLN: 0.0000 [IU] | Freq: Every day | SUBCUTANEOUS | Status: DC

## 2020-04-17 MED ORDER — METHYLPREDNISOLONE SODIUM SUCC 40 MG IJ SOLR
40.0000 mg | Freq: Two times a day (BID) | INTRAMUSCULAR | Status: DC
  Administered 2020-04-18: 40 mg via INTRAVENOUS
  Filled 2020-04-17: qty 1

## 2020-04-17 MED ORDER — SODIUM CHLORIDE 0.9 % IV SOLN: 250.0000 mL | INTRAVENOUS | Status: DC | PRN

## 2020-04-17 MED ORDER — POLYETHYLENE GLYCOL 3350 17 G PO PACK: 17.0000 g | PACK | Freq: Every day | ORAL | Status: DC | PRN

## 2020-04-17 MED ORDER — ASPIRIN EC 81 MG PO TBEC
81.0000 mg | DELAYED_RELEASE_TABLET | Freq: Every day | ORAL | Status: DC
  Administered 2020-04-17 – 2020-04-18 (×2): 81 mg via ORAL
  Filled 2020-04-17 (×2): qty 1

## 2020-04-17 MED ORDER — ONDANSETRON HCL 4 MG PO TABS: 4.0000 mg | ORAL_TABLET | Freq: Four times a day (QID) | ORAL | Status: DC | PRN

## 2020-04-17 NOTE — Progress Notes (Signed)
Patient refused dulera inhaler. He stated ," it made him cough worse and he already had a broken rib and did not wish to take it" .  Christopher Mata has been DC and returned to [employer].

## 2020-04-17 NOTE — ED Notes (Signed)
CRITICAL VALUE ALERT  Critical Value:  Lactic Acid 2.0  Date & Time Notied:  04/17/20 1432  Provider Notified: Dr. Mariea Clonts  Orders Received/Actions taken: MD notified

## 2020-04-17 NOTE — ED Triage Notes (Signed)
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 Pt reported to EMS he had tuna yesterday and hands started itching. He woke up approx 0230 and had syncopal episode and fell hit right ribs.  Pt took 25 mg po benadryl due to having whelps on body. Systolic BP 64/53 cool and diaphoretic  pt given a liter of fluids and sys up to 80. Pt was also given 50 mg benadryl IV.

## 2020-04-17 NOTE — ED Notes (Signed)
Pt is aware we need urine sample, urinal at bedside.  

## 2020-04-17 NOTE — ED Provider Notes (Signed)
[facility] EMERGENCY DEPARTMENT Provider Note   CSN: [id number] Arrival date & time: 04/17/20  [id number]     History Chief Complaint  Patient presents with  . Loss of Consciousness  . Allergic Reaction    Christopher Mata is a 74 y.o. male.  HPI   74 year old male presenting after a fall last night.  He woke up around 2:30 AM to use the bathroom.  He felt very lightheaded and fell onto his right side.  He thinks he had a brief loss of consciousness.  He struck his chest and has had significant right-sided chest pain since that time.  Dizziness and lightheadedness persisted throughout the morning.  He also noticed hives on his body.  He took 25 mg of Benadryl.  Symptoms not improve so called EMS.  On their arrival he was hypotensive.  They noted urticaria as well.  They gave an additional 50 mg of IV diphenhydramine.  Blood pressure improved to the 80s.  On arrival to the emergency room blood pressure was improved to low normal.  Obvious urticaria on his abdomen and extremities.  He did not seem to be acutely distressed though.  He has chronic dyspnea but feels like his breathing has been a little bit worse since last night.  He has not noticed any wheezing.  Nausea last night as well but no vomiting or diarrhea.  No new exposures that he is aware of. Reports hx of alpha gal allergy and had tuna yesterday. Did not consume any mammalian products that he is aware of.   Past Medical History:  Diagnosis Date  . Allergy to alpha-gal   . GERD (gastroesophageal reflux disease)   . History of hiatal hernia   . History of nocturia   . Hyperlipidemia   . Hypertension   . Obesity (BMI 30-39.9)   . 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 Seasonal allergies   . Shortness of breath dyspnea    with exertion  . Vertigo     Patient Active Problem List   Diagnosis Date Noted  . ED (erectile dysfunction) 05/19/2019  . Prediabetes 04/17/2018  .  Hyperlipidemia LDL goal <130 04/17/2018  . 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 Seasonal allergies 03/18/2015  . GERD (gastroesophageal reflux disease)   . Hypertension   . Overweight (BMI 25.0-29.9)     Past Surgical History:  Procedure Laterality Date  . COLONOSCOPY W/ BIOPSIES AND POLYPECTOMY    . ESOPHAGOGASTRODUODENOSCOPY N/A 01/13/2015   Procedure: ESOPHAGOGASTRODUODENOSCOPY (EGD);  Surgeon: Jeani HawkingPatrick Hung, MD;  [facility];  Service: Endoscopy;  Laterality: N/A;  . EYE SURGERY     repaired a detached retina  . HIATAL HERNIA REPAIR  03/17/2015   Procedure: LAPAROSCOPIC REPAIR OF HIATAL HERNIA;  Surgeon: Axel FillerArmando Ramirez, MD;  Location: [facility] ORS;  Service: General;;  . INSERTION OF MESH  03/17/2015   Procedure: INSERTION OF MESH;  Surgeon: Axel FillerArmando Ramirez, MD;  Location: [facility] ORS;  Service: General;;       Family History  Problem Relation Age of Onset  . Heart disease Mother   . Hypertension Mother   . Heart disease Father   . Hypertension Father     Social History   Tobacco Use  . Smoking status: Former Smoker    Packs/day: 1.00    Years: 20.00    Pack years: 20.00    Quit date: 12/06/1986    Years since quitting: 33.3  . Smokeless tobacco: Current User  Types: Snuff  Vaping Use  . Vaping Use: Never used  Substance Use Topics  . Alcohol use: No    Comment: "quit 1.5 years ago.'  . Drug use: No    Home Medications Prior to Admission medications   Medication Sig Start Date End Date Taking? Authorizing Provider  aspirin EC 81 MG tablet Take 81 mg by mouth daily.   Yes [provider]  atorvastatin (LIPITOR) 20 MG tablet TAKE 1 TABLET BY MOUTH EVERYDAY AT BEDTIME 05/19/19  Yes Dettinger, Elige Radon, MD  fenofibrate (TRICOR) 145 MG tablet Take 1 tablet (145 mg total) by mouth daily. 05/19/19  Yes Dettinger, Elige Radon, MD  finasteride (PROSCAR) 5 MG tablet Take 1 tablet (5 mg total) by mouth daily. 05/19/19   Yes Dettinger, Elige Radon, MD  metoprolol tartrate (LOPRESSOR) 25 MG tablet Take 1 tablet (25 mg total) by mouth 2 (two) times daily. 05/19/19  Yes Dettinger, Elige Radon, MD  mometasone-formoterol (DULERA) 100-5 MCG/ACT AERO Inhale 2 puffs into the lungs in the morning and at bedtime. 03/23/20  Yes Dettinger, Elige Radon, MD  omeprazole (PRILOSEC) 20 MG capsule Take 1 capsule (20 mg total) by mouth daily. (Needs to be seen before next refill) 05/19/19  Yes Dettinger, Elige Radon, MD  valsartan (DIOVAN) 80 MG tablet Take 1 tablet (80 mg total) by mouth daily. 05/19/19  Yes Dettinger, Elige Radon, MD  sildenafil (REVATIO) 20 MG tablet Take 1-3 tablets (20-60 mg total) by mouth as needed. Patient not taking: Reported on 04/17/2020 05/19/19   Dettinger, Elige Radon, MD    Allergies    Meat [alpha-gal]  Review of Systems   Review of Systems All systems reviewed and negative, other than as noted in HPI.  Physical Exam Updated Vital Signs BP (!) 78/60   Pulse 98   Temp 98 F (36.7 C) (Oral)   Resp (!) 22   Ht 5\' 10"  (1.778 m)   Wt 99.8 kg   SpO2 96%   BMI 31.57 kg/m   Physical Exam Vitals and nursing note reviewed.  Constitutional:      General: He is not in acute distress.    Appearance: He is well-developed.  HENT:     Head: Normocephalic and atraumatic.  Eyes:     General:        Right eye: No discharge.        Left eye: No discharge.     Conjunctiva/sclera: Conjunctivae normal.  Cardiovascular:     Rate and Rhythm: Normal rate and regular rhythm.     Heart sounds: Normal heart sounds. No murmur heard.  No friction rub. No gallop.   Pulmonary:     Effort: Pulmonary effort is normal. No respiratory distress.     Breath sounds: Normal breath sounds.     Comments: R chest wall tenderness. No crepitus. Coarse breath sounds b/l. No wheezing.  Chest:     Chest wall: Tenderness present.  Abdominal:     General: There is no distension.     Palpations: Abdomen is soft.     Tenderness: There is no  abdominal tenderness.  Musculoskeletal:        General: No tenderness.     Cervical back: Neck supple.  Skin:    General: Skin is warm and dry.     Findings: Rash present.     Comments: Urticarial rash to lower chest, abdomen and b/l lower legs.   Neurological:     Mental Status: He is alert.  Psychiatric:  Behavior: Behavior normal.        Thought Content: Thought content normal.     ED Results / Procedures / Treatments   Labs (all labs ordered are listed, but only abnormal results are displayed) Labs Reviewed  CBC WITH DIFFERENTIAL/PLATELET - Abnormal; Notable for the following components:      Result Value   WBC 19.6 (*)    Hemoglobin 17.5 (*)    HCT 53.9 (*)    Neutro Abs 16.2 (*)    Monocytes Absolute 1.2 (*)    Abs Immature Granulocytes 0.13 (*)    All other components within normal limits  BASIC METABOLIC PANEL - Abnormal; Notable for the following components:   CO2 19 (*)    Glucose, Bld 152 (*)    BUN 27 (*)    Creatinine, Ser 2.89 (*)    Calcium 8.3 (*)    GFR calc non Af Amer 21 (*)    GFR calc Af Amer 24 (*)    All other components within normal limits  SARS CORONAVIRUS 2 BY RT PCR (HOSPITAL ORDER, PERFORMED IN [facility])  CULTURE, BLOOD (ROUTINE X 2)  CULTURE, BLOOD (ROUTINE X 2)  URINE CULTURE  URINALYSIS, ROUTINE W REFLEX MICROSCOPIC  LACTIC ACID, PLASMA  LACTIC ACID, PLASMA    EKG EKG Interpretation  Date/Time:  Monday April 17 2020 08:56:35 EDT Ventricular Rate:  93 PR Interval:    QRS Duration: 88 QT Interval:  349 QTC Calculation: 435 R Axis:   -29 Text Interpretation: Sinus rhythm Borderline prolonged PR interval Probable left atrial enlargement Left axis deviation Anteroseptal infarct, age indeterminate Borderline T abnormalities, inferior leads Confirmed by Raeford Razor [phone]) on 04/17/2020 9:25:57 AM   Radiology DG Ribs Unilateral W/Chest Right  Result Date: 04/17/2020 CLINICAL DATA:  Right rib pain. EXAM:  RIGHT RIBS AND CHEST - 3+ VIEW COMPARISON:  Chest x-ray 03/23/2020 FINDINGS: No evidence for pneumothorax. Peripheral chronic interstitial opacity is again noted, left greater than right and similar to prior. No pulmonary edema. No evidence for new focal airspace consolidation. No pleural effusion. Oblique views of the right ribs show acute fracture of the lateral seventh rib and potentially anterior eighth rib. IMPRESSION: 1. Acute fracture of the right seventh and probably right anterior eighth ribs. 2. Chronic interstitial lung disease. No new or progressive findings. Electronically Signed   By: Kennith Center M.D.   On: 04/17/2020 09:50    Procedures Procedures (including critical care time)  CRITICAL CARE Performed by: Raeford Razor Total critical care time: 35 minutes Critical care time was exclusive of separately billable procedures and treating other patients. Critical care was necessary to treat or prevent imminent or life-threatening deterioration. Critical care was time spent personally by me on the following activities: development of treatment plan with patient and/or surrogate as well as nursing, discussions with consultants, evaluation of patient's response to treatment, examination of patient, obtaining history from patient or surrogate, ordering and performing treatments and interventions, ordering and review of laboratory studies, ordering and review of radiographic studies, pulse oximetry and re-evaluation of patient's condition.   Medications Ordered in ED Medications  0.9 %  sodium chloride infusion ( Intravenous Restarted 04/17/20 1214)  methylPREDNISolone sodium succinate (SOLU-MEDROL) 40 mg/mL injection 80 mg (80 mg Intravenous Given 04/17/20 0913)  fentaNYL (SUBLIMAZE) injection 50 mcg (50 mcg Intravenous Given 04/17/20 0912)  EPINEPHrine (EPI-PEN) injection 0.3 mg (0.3 mg Intramuscular Given 04/17/20 0910)  sodium chloride 0.9 % bolus 1,000 mL (0 mLs Intravenous Stopped  04/17/20 1100)  sodium chloride 0.9 % bolus 1,000 mL (0 mLs Intravenous Stopped 04/17/20 1152)  lactated ringers bolus 1,000 mL (1,000 mLs Intravenous New Bag/Given 04/17/20 1212)    ED Course  I have reviewed the triage vital signs and the nursing notes.  Pertinent labs & imaging results that were available during my care of the patient were reviewed by me and considered in my medical decision making (see chart for details).    MDM Rules/Calculators/A&P                          74 year old male with a fall.  Suspect syncopal event related to hypotension.  Concern for anaphylaxis.  Urticarial rash.  Hypotension.  Worsening of his baseline dyspnea.  Some GI symptoms or nausea.  Given his degree of symptoms, he was given steroids and epinephrine on arrival to the ED.  Unsure of precipitant. Hx of alpha gal but consuming tuna shouldn't be an issue.   Subsequent work-up noted AKI.  Creatinine essentially doubled from his baseline.  Suspect this may be from poor renal perfusion from persistent hypotension.  Has felt tired recently but denies any new medications or vomiting/diarrhea recently. We will continue resuscitation with fluids. May need additional epinephrine. We will continue to observe and may need admitted if BP doesn't stabilize.   He has a leukocytosis, but this may be stress demargination given  illness or from his trauma.  Less likely infectious.  Work-up was broadened though.  Deferring from empiric antibiotics at this time.  Continued pain management for his rib fractures.  Incentive spirometry.  BP somewhat better but still hypotensive. This is after almost 4L of IVF at this point.  Will admit.     Final Clinical Impression(s) / ED Diagnoses Final diagnoses:  Anaphylaxis, initial encounter  Syncope, unspecified syncope type  AKI (acute kidney injury) (HCC)  Hypotension, unspecified hypotension type    Rx / DC Orders ED Discharge Orders    None       Raeford Razor, MD  04/23/20 1132

## 2020-04-17 NOTE — ED Notes (Signed)
RT notified of need for incentive spirometry.

## 2020-04-17 NOTE — H&P (Signed)
Patient Demographics:    Christopher Mata, is a 74 y.o. male  MRN: [id number]   DOB - March 26, 1946  Admit Date - 04/17/2020  Outpatient Primary MD for the patient is Dettinger, Elige Radon, MD   Assessment & Plan:    Principal Problem:   Syncope Active Problems:   Hypertension   Hypotension   ILD (interstitial lung disease)/UIP   Allergic reaction to alpha-gal-- Tick Bite Related   AKI (acute kidney injury) (HCC)    1)Syncope- admit to telemetry monitored unit, watch for arrhythmias, check serial troponins and EKG for rule out ACS protocol, check echocardiogram to rule out significant aortic stenosis or other outflow obstruction, and also to evaluate EF and to rule out segmental/Regional wall motion abnormalities. Check carotid artery Dopplers without hemodynamically significant stenosis --Syncope and persistent hypotension could be related to alpha gal reaction--patient denies eating red meat he did have tuna -Solu-Medrol, Benadryl and Pepcid given just in case this is an allergic reaction   2)AKI----acute kidney injury on CKD stage IIIa-   creatinine on admission=2.89  , baseline creatinine =1.33 (03/23/20), renally adjust medications, avoid nephrotoxic agents / dehydration  / hypotension -- -Hold Diovan,  continue to hydrate IV and orally,  3)UIP/Pulm Fibrosis----bronchodilators as ordered, outpatient follow-up with pulmonologist advised, please see high-resolution CT chest from 04/17/2020  4) right seventh rib fracture--- in the setting of mechanical fall/syncope, as needed oxycodone and IV morphine sulfate-ordered as needed  5) nicotine abuse--- patient chews a lot of tobacco/dips----nicotine patch as ordered   6)lactic acidosis---- is most likely due to dehydration with AKI and persistent hypotension with poor  tissue perfusion--very low clinical index of suspicion for sepsis   Disposition/Need for in-Hospital Stay- patient unable to be discharged at this time due to --- syncope with persistent hypotension with significant worsening of renal function  Dispo: The patient is from: Home              Anticipated d/c is to: Home              Anticipated d/c date is: 1 day              Patient currently is not medically stable to d/c. Barriers: Not Clinically Stable- --significant worsening of renal function requiring hydration and further monitoring as well as persistent hypotension requiring IV fluids   With History of - Reviewed by me  Past Medical History:  Diagnosis Date  . Allergy to alpha-gal   . GERD (gastroesophageal reflux disease)   . History of hiatal hernia   . History of nocturia   . Hyperlipidemia   . Hypertension   . Obesity (BMI 30-39.9)   . 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 Seasonal allergies   . Shortness of breath dyspnea    with exertion  . Vertigo       Past Surgical History:  Procedure Laterality Date  . COLONOSCOPY W/ BIOPSIES AND POLYPECTOMY    .  ESOPHAGOGASTRODUODENOSCOPY N/A 01/13/2015   Procedure: ESOPHAGOGASTRODUODENOSCOPY (EGD);  Surgeon: Jeani Hawking, MD;  Location: [facility] ENDOSCOPY;  Service: Endoscopy;  Laterality: N/A;  . EYE SURGERY     repaired a detached retina  . HIATAL HERNIA REPAIR  03/17/2015   Procedure: LAPAROSCOPIC REPAIR OF HIATAL HERNIA;  Surgeon: Axel Filler, MD;  Location: [facility] ORS;  Service: General;;  . INSERTION OF MESH  03/17/2015   Procedure: INSERTION OF MESH;  Surgeon: Axel Filler, MD;  Location: [facility] ORS;  Service: General;;      Chief Complaint  Patient presents with  . Loss of Consciousness  . Allergic Reaction      HPI:    Christopher Mata  is a 74 y.o. male with past medical history relevant for nicotine abuse (dips/chews), BPH withLUTs, UIP, HTN, GERD and history of alpha  gal type reaction who presents from home after syncope/fall with right-sided chest pain --Work-up in ED reveals right-sided seventh rib fracture on chest x-ray, CT chest without pneumothorax or hemothorax however patient has UIP/ILD.....  --AKI with creatinine up to 2.89 from a baseline of 1.3 and elevated D-dimer noted No fever  Or chills   No Nausea, Vomiting or Diarrhea -In the ED patient was found to be persistently hypotensive despite IV fluid boluses, lactic acid was elevated, no concerns for sepsis (lactic acidosis is most likely due to dehydration and persistent hypotension with poor tissue perfusion)  Additional history obtained from patient's 2 daughters at bedside  -Troponin is not elevated, leukocytosis noted -UA noted, urine culture pending, blood cultures pending   Review of systems:    In addition to the HPI above,   A full Review of  Systems was done, all other systems reviewed are negative except as noted above in HPI , .    Social History:  Reviewed by me    Social History   Tobacco Use  . Smoking status: Former Smoker    Packs/day: 1.00    Years: 20.00    Pack years: 20.00    Quit date: 12/06/1986    Years since quitting: 33.3  . Smokeless tobacco: Current User    Types: Snuff  Substance Use Topics  . Alcohol use: No    Comment: "quit 1.5 years ago.'       Family History :  Reviewed by me    Family History  Problem Relation Age of Onset  . Heart disease Mother   . Hypertension Mother   . Heart disease Father   . Hypertension Father      Home Medications:   Prior to Admission medications   Medication Sig Start Date End Date Taking? Authorizing Provider  aspirin EC 81 MG tablet Take 81 mg by mouth daily.   Yes [provider]  atorvastatin (LIPITOR) 20 MG tablet TAKE 1 TABLET BY MOUTH EVERYDAY AT BEDTIME 05/19/19  Yes Dettinger, Elige Radon, MD  fenofibrate (TRICOR) 145 MG tablet Take 1 tablet (145 mg total) by mouth daily. 05/19/19  Yes  Dettinger, Elige Radon, MD  finasteride (PROSCAR) 5 MG tablet Take 1 tablet (5 mg total) by mouth daily. 05/19/19  Yes Dettinger, Elige Radon, MD  metoprolol tartrate (LOPRESSOR) 25 MG tablet Take 1 tablet (25 mg total) by mouth 2 (two) times daily. 05/19/19  Yes Dettinger, Elige Radon, MD  mometasone-formoterol (DULERA) 100-5 MCG/ACT AERO Inhale 2 puffs into the lungs in the morning and at bedtime. 03/23/20  Yes Dettinger, Elige Radon, MD  omeprazole (PRILOSEC) 20 MG capsule Take 1 capsule (20  mg total) by mouth daily. (Needs to be seen before next refill) 05/19/19  Yes Dettinger, Elige Radon, MD  valsartan (DIOVAN) 80 MG tablet Take 1 tablet (80 mg total) by mouth daily. 05/19/19  Yes Dettinger, Elige Radon, MD  sildenafil (REVATIO) 20 MG tablet Take 1-3 tablets (20-60 mg total) by mouth as needed. Patient not taking: Reported on 04/17/2020 05/19/19   Dettinger, Elige Radon, MD     Allergies:     Allergies  Allergen Reactions  . Meat [Alpha-Gal] Rash    Red meat     Physical Exam:   Vitals  Blood pressure 98/69, pulse 91, temperature 97.9 F (36.6 C), temperature source Oral, resp. rate 17, height  (1.778 m), weight 99.8 kg, SpO2 97 %.  Physical Examination: General appearance - alert, well appearing, and in no distress  Mental status - alert, oriented to person, place, and time, Eyes - sclera anicteric Neck - supple, no JVD elevation , Chest -Velcro type brace on auscultation bilaterally, symmetrical air movement,  Heart - S1 and S2 normal, regular  Abdomen - soft, nontender, nondistended, no masses or organomegaly Neurological - screening mental status exam normal, neck supple without rigidity, cranial nerves II through XII intact, DTR's normal and symmetric Extremities - no pedal edema noted, intact peripheral pulses  Skin - warm, dry     Data Review:    CBC Recent Labs  Lab 04/17/20 0919  WBC 19.6*  HGB 17.5*  HCT 53.9*  PLT 280  MCV 92.9  MCH 30.2  MCHC 32.5  RDW 13.0  LYMPHSABS  1.9  MONOABS 1.2*  EOSABS 0.0  BASOSABS 0.1   ------------------------------------------------------------------------------------------------------------------  Chemistries  Recent Labs  Lab 04/17/20 0919  NA 138  K 4.3  CL 110  CO2 19*  GLUCOSE 152*  BUN 27*  CREATININE 2.89*  CALCIUM 8.3*   ------------------------------------------------------------------------------------------------------------------ estimated creatinine clearance is 27 mL/min (A) (by C-G formula based on SCr of 2.89 mg/dL (H)). ------------------------------------------------------------------------------------------------------------------ No results for input(s): TSH, T4TOTAL, T3FREE, THYROIDAB in the last 72 hours.  Invalid input(s): FREET3   Coagulation profile No results for input(s): INR, PROTIME in the last 168 hours. ------------------------------------------------------------------------------------------------------------------- Recent Labs    04/17/20 1609  DDIMER 18.65*   -------------------------------------------------------------------------------------------------------------------  Cardiac Enzymes No results for input(s): CKMB, TROPONINI, MYOGLOBIN in the last 168 hours.  Invalid input(s): CK ------------------------------------------------------------------------------------------------------------------    Component Value Date/Time   BNP 15.6 04/04/2015 1608     ---------------------------------------------------------------------------------------------------------------  Urinalysis    Component Value Date/Time   COLORURINE YELLOW 04/17/2020 1647   APPEARANCEUR CLOUDY (A) 04/17/2020 1647   LABSPEC 1.019 04/17/2020 1647   PHURINE 5.0 04/17/2020 1647   GLUCOSEU NEGATIVE 04/17/2020 1647   HGBUR SMALL (A) 04/17/2020 1647   BILIRUBINUR SMALL (A) 04/17/2020 1647   KETONESUR NEGATIVE 04/17/2020 1647   PROTEINUR 30 (A) 04/17/2020 1647   NITRITE NEGATIVE 04/17/2020 1647    LEUKOCYTESUR NEGATIVE 04/17/2020 1647    ----------------------------------------------------------------------------------------------------------------   Imaging Results:    DG Ribs Unilateral W/Chest Right  Result Date: 04/17/2020 CLINICAL DATA:  Right rib pain. EXAM: RIGHT RIBS AND CHEST - 3+ VIEW COMPARISON:  Chest x-ray 03/23/2020 FINDINGS: No evidence for pneumothorax. Peripheral chronic interstitial opacity is again noted, left greater than right and similar to prior. No pulmonary edema. No evidence for new focal airspace consolidation. No pleural effusion. Oblique views of the right ribs show acute fracture of the lateral seventh rib and potentially anterior eighth rib. IMPRESSION: 1. Acute fracture of the right seventh and  probably right anterior eighth ribs. 2. Chronic interstitial lung disease. No new or progressive findings. Electronically Signed   By: Kennith Center M.D.   On: 04/17/2020 09:50   CT Chest High Resolution  Result Date: 04/17/2020 CLINICAL DATA:  Inpatient. Right rib pain, patient woke up on floor. Right rib fractures on radiographs from earlier today. EXAM: CT CHEST WITHOUT CONTRAST TECHNIQUE: Multidetector CT imaging of the chest was performed following the standard protocol without intravenous contrast. High resolution imaging of the lungs, as well as inspiratory and expiratory imaging, was performed. COMPARISON:  04/17/2020 chest and right rib radiographs. 03/27/2015 chest CT angiogram. FINDINGS: Cardiovascular: Normal heart size. No significant pericardial effusion/thickening. Left anterior descending and right coronary atherosclerosis. Atherosclerotic nonaneurysmal thoracic aorta. Normal caliber pulmonary arteries. Mediastinum/Nodes: No discrete thyroid nodules. Unremarkable esophagus. No pathologically enlarged axillary, mediastinal or hilar lymph nodes, noting limited sensitivity for the detection of hilar adenopathy on this noncontrast study. Lungs/Pleura: No  pneumothorax. No pleural effusion. Mild centrilobular emphysema with mild diffuse wall thickening. No acute consolidative airspace disease, lung masses or significant pulmonary nodules. There is patchy moderate confluent subpleural reticulation and minimal ground-glass opacity throughout both lungs with associated mild traction bronchiectasis and architectural distortion. There is a slight basilar predominance to these findings. Mild honeycombing is scattered in both lungs, most prominent in anterior lingula and basilar left lower lobe. These findings have progressed since 04/04/2015 chest CT. Upper abdomen: History of hiatal hernia repair with probable small recurrent hiatal hernia. Stable peripherally calcified 3.6 cm cystic lesion in the superior spleen, considered benign. Musculoskeletal: No aggressive appearing focal osseous lesions. Acute nondisplaced anterolateral right seventh rib fracture. No additional fractures. Mild thoracic spondylosis. Mild bilateral gynecomastia, asymmetric to the right, slightly increased. IMPRESSION: 1. Acute nondisplaced anterolateral right seventh rib fracture. No pneumothorax. 2. Spectrum of findings compatible with basilar predominant fibrotic interstitial lung disease with mild honeycombing, progressed since 04/04/2015 chest CT. Findings are consistent with UIP per consensus guidelines: Diagnosis of Idiopathic Pulmonary Fibrosis: An Official ATS/ERS/JRS/ALAT Clinical Practice Guideline. Am Rosezetta Schlatter Crit Care Med Vol 198, Iss 5, [phone], Jun 07 2017. 3. Two-vessel coronary atherosclerosis. 4. Status post hiatal hernia repair with probable small recurrent hiatal hernia. 5. Aortic Atherosclerosis (ICD10-I70.0) and Emphysema (ICD10-J43.9). Electronically Signed   By: Delbert Phenix M.D.   On: 04/17/2020 16:31   US Carotid Bilateral  Result Date: 04/17/2020 CLINICAL DATA:  Syncope, fall, hypertension and hyperlipidemia. EXAM: BILATERAL CAROTID DUPLEX ULTRASOUND TECHNIQUE: Wallace Cullens  scale imaging, color Doppler and duplex ultrasound were performed of bilateral carotid and vertebral arteries in the neck. COMPARISON:  None. FINDINGS: Criteria: Quantification of carotid stenosis is based on velocity parameters that correlate the residual internal carotid diameter with NASCET-based stenosis levels, using the diameter of the distal internal carotid lumen as the denominator for stenosis measurement. The following velocity measurements were obtained: RIGHT ICA:  105/21 cm/sec CCA:  107/18 cm/sec SYSTOLIC ICA/CCA RATIO:  1.0 ECA:  124 cm/sec LEFT ICA:  108/29 cm/sec CCA:  100/14 cm/sec SYSTOLIC ICA/CCA RATIO:  1.1 ECA:  26 cm/sec RIGHT CAROTID ARTERY: There is a mild to moderate amount of partially calcified plaque at the level of the carotid bulb and extending into the right ICA origin. Estimated right ICA stenosis is less than 50%. RIGHT VERTEBRAL ARTERY: Antegrade flow with normal waveform and velocity. LEFT CAROTID ARTERY: Mild amount of partially calcified plaque is present at the level of the carotid bulb and proximal left ICA. Estimated left ICA stenosis is less than 50%. LEFT  VERTEBRAL ARTERY: Antegrade flow with normal waveform and velocity. IMPRESSION: Partially calcified plaque at the level of both carotid bulbs and proximal internal carotid arteries, right greater than left. No significant carotid stenosis identified with estimated bilateral ICA stenoses of less than 50%. Electronically Signed   By: Irish LackGlenn  Yamagata M.D.   On: 04/17/2020 16:31    Radiological Exams on Admission: DG Ribs Unilateral W/Chest Right  Result Date: 04/17/2020 CLINICAL DATA:  Right rib pain. EXAM: RIGHT RIBS AND CHEST - 3+ VIEW COMPARISON:  Chest x-ray 03/23/2020 FINDINGS: No evidence for pneumothorax. Peripheral chronic interstitial opacity is again noted, left greater than right and similar to prior. No pulmonary edema. No evidence for new focal airspace consolidation. No pleural effusion. Oblique views of  the right ribs show acute fracture of the lateral seventh rib and potentially anterior eighth rib. IMPRESSION: 1. Acute fracture of the right seventh and probably right anterior eighth ribs. 2. Chronic interstitial lung disease. No new or progressive findings. Electronically Signed   By: Kennith CenterEric  Mansell M.D.   On: 04/17/2020 09:50   CT Chest High Resolution  Result Date: 04/17/2020 CLINICAL DATA:  Inpatient. Right rib pain, patient woke up on floor. Right rib fractures on radiographs from earlier today. EXAM: CT CHEST WITHOUT CONTRAST TECHNIQUE: Multidetector CT imaging of the chest was performed following the standard protocol without intravenous contrast. High resolution imaging of the lungs, as well as inspiratory and expiratory imaging, was performed. COMPARISON:  04/17/2020 chest and right rib radiographs. 03/27/2015 chest CT angiogram. FINDINGS: Cardiovascular: Normal heart size. No significant pericardial effusion/thickening. Left anterior descending and right coronary atherosclerosis. Atherosclerotic nonaneurysmal thoracic aorta. Normal caliber pulmonary arteries. Mediastinum/Nodes: No discrete thyroid nodules. Unremarkable esophagus. No pathologically enlarged axillary, mediastinal or hilar lymph nodes, noting limited sensitivity for the detection of hilar adenopathy on this noncontrast study. Lungs/Pleura: No pneumothorax. No pleural effusion. Mild centrilobular emphysema with mild diffuse wall thickening. No acute consolidative airspace disease, lung masses or significant pulmonary nodules. There is patchy moderate confluent subpleural reticulation and minimal ground-glass opacity throughout both lungs with associated mild traction bronchiectasis and architectural distortion. There is a slight basilar predominance to these findings. Mild honeycombing is scattered in both lungs, most prominent in anterior lingula and basilar left lower lobe. These findings have progressed since 04/04/2015 chest CT. Upper  abdomen: History of hiatal hernia repair with probable small recurrent hiatal hernia. Stable peripherally calcified 3.6 cm cystic lesion in the superior spleen, considered benign. Musculoskeletal: No aggressive appearing focal osseous lesions. Acute nondisplaced anterolateral right seventh rib fracture. No additional fractures. Mild thoracic spondylosis. Mild bilateral gynecomastia, asymmetric to the right, slightly increased. IMPRESSION: 1. Acute nondisplaced anterolateral right seventh rib fracture. No pneumothorax. 2. Spectrum of findings compatible with basilar predominant fibrotic interstitial lung disease with mild honeycombing, progressed since 04/04/2015 chest CT. Findings are consistent with UIP per consensus guidelines: Diagnosis of Idiopathic Pulmonary Fibrosis: An Official ATS/ERS/JRS/ALAT Clinical Practice Guideline. Am Rosezetta SchlatterJ Respir Crit Care Med Vol 198, Iss 5, [phone], Jun 07 2017. 3. Two-vessel coronary atherosclerosis. 4. Status post hiatal hernia repair with probable small recurrent hiatal hernia. 5. Aortic Atherosclerosis (ICD10-I70.0) and Emphysema (ICD10-J43.9). Electronically Signed   By: Delbert PhenixJason A Poff M.D.   On: 04/17/2020 16:31   US Carotid Bilateral  Result Date: 04/17/2020 CLINICAL DATA:  Syncope, fall, hypertension and hyperlipidemia. EXAM: BILATERAL CAROTID DUPLEX ULTRASOUND TECHNIQUE: Wallace CullensGray scale imaging, color Doppler and duplex ultrasound were performed of bilateral carotid and vertebral arteries in the neck. COMPARISON:  None. FINDINGS: Criteria: Quantification  of carotid stenosis is based on velocity parameters that correlate the residual internal carotid diameter with NASCET-based stenosis levels, using the diameter of the distal internal carotid lumen as the denominator for stenosis measurement. The following velocity measurements were obtained: RIGHT ICA:  105/21 cm/sec CCA:  107/18 cm/sec SYSTOLIC ICA/CCA RATIO:  1.0 ECA:  124 cm/sec LEFT ICA:  108/29 cm/sec CCA:  100/14 cm/sec  SYSTOLIC ICA/CCA RATIO:  1.1 ECA:  26 cm/sec RIGHT CAROTID ARTERY: There is a mild to moderate amount of partially calcified plaque at the level of the carotid bulb and extending into the right ICA origin. Estimated right ICA stenosis is less than 50%. RIGHT VERTEBRAL ARTERY: Antegrade flow with normal waveform and velocity. LEFT CAROTID ARTERY: Mild amount of partially calcified plaque is present at the level of the carotid bulb and proximal left ICA. Estimated left ICA stenosis is less than 50%. LEFT VERTEBRAL ARTERY: Antegrade flow with normal waveform and velocity. IMPRESSION: Partially calcified plaque at the level of both carotid bulbs and proximal internal carotid arteries, right greater than left. No significant carotid stenosis identified with estimated bilateral ICA stenoses of less than 50%. Electronically Signed   By: Irish Lack M.D.   On: 04/17/2020 16:31   DVT Prophylaxis -SCD  /heparin AM Labs Ordered, also please review Full Orders  Family Communication: Admission, patients condition and plan of care including tests being ordered have been discussed with the patient and daughters at bedside who indicate understanding and agree with the plan   Code Status - Full Code  Likely DC to  home  Condition   stable  Shon Hale M.D on 04/17/2020 at 7:41 PM Go to [email or web address] -  for contact info  [facility] - Office  [phone]

## 2020-04-18 ENCOUNTER — Telehealth: Payer: Self-pay | Admitting: *Deleted

## 2020-04-18 ENCOUNTER — Observation Stay (HOSPITAL_BASED_OUTPATIENT_CLINIC_OR_DEPARTMENT_OTHER): Payer: Medicare Other

## 2020-04-18 DIAGNOSIS — R55 Syncope and collapse: Secondary | ICD-10-CM | POA: Diagnosis not present

## 2020-04-18 DIAGNOSIS — I361 Nonrheumatic tricuspid (valve) insufficiency: Secondary | ICD-10-CM

## 2020-04-18 DIAGNOSIS — I952 Hypotension due to drugs: Secondary | ICD-10-CM

## 2020-04-18 DIAGNOSIS — T781XXA Other adverse food reactions, not elsewhere classified, initial encounter: Secondary | ICD-10-CM | POA: Diagnosis not present

## 2020-04-18 DIAGNOSIS — T7803XA Anaphylactic reaction due to other fish, initial encounter: Secondary | ICD-10-CM | POA: Diagnosis not present

## 2020-04-18 LAB — GLUCOSE, CAPILLARY
Glucose-Capillary: 125 mg/dL — ABNORMAL HIGH (ref 70–99)
Glucose-Capillary: 189 mg/dL — ABNORMAL HIGH (ref 70–99)

## 2020-04-18 LAB — ECHOCARDIOGRAM COMPLETE
Height: 70 in
Weight: 3520 oz

## 2020-04-18 LAB — BASIC METABOLIC PANEL
Anion gap: 5 (ref 5–15)
BUN: 23 mg/dL (ref 8–23)
CO2: 18 mmol/L — ABNORMAL LOW (ref 22–32)
Calcium: 7.6 mg/dL — ABNORMAL LOW (ref 8.9–10.3)
Chloride: 114 mmol/L — ABNORMAL HIGH (ref 98–111)
Creatinine, Ser: 1.62 mg/dL — ABNORMAL HIGH (ref 0.61–1.24)
GFR calc Af Amer: 48 mL/min — ABNORMAL LOW (ref >60)
GFR calc non Af Amer: 41 mL/min — ABNORMAL LOW (ref >60)
Glucose, Bld: 145 mg/dL — ABNORMAL HIGH (ref 70–99)
Potassium: 3.8 mmol/L (ref 3.5–5.1)
Sodium: 137 mmol/L (ref 135–145)

## 2020-04-18 LAB — CBC
HCT: 38.5 % — ABNORMAL LOW (ref 39.0–52.0)
Hemoglobin: 12.3 g/dL — ABNORMAL LOW (ref 13.0–17.0)
MCH: 29.6 pg (ref 26.0–34.0)
MCHC: 31.9 g/dL (ref 30.0–36.0)
MCV: 92.5 fL (ref 80.0–100.0)
Platelets: 214 10*3/uL (ref 150–400)
RBC: 4.16 MIL/uL — ABNORMAL LOW (ref 4.22–5.81)
RDW: 12.8 % (ref 11.5–15.5)
WBC: 14.3 10*3/uL — ABNORMAL HIGH (ref 4.0–10.5)
nRBC: 0 % (ref 0.0–0.2)

## 2020-04-18 MED ORDER — ACETAMINOPHEN 325 MG PO TABS: 650.0000 mg | ORAL_TABLET | ORAL | 2 refills | Status: DC | PRN

## 2020-04-18 MED ORDER — TRAMADOL HCL 50 MG PO TABS: 50.0000 mg | ORAL_TABLET | Freq: Four times a day (QID) | ORAL | 0 refills | Status: DC | PRN

## 2020-04-18 MED ORDER — ASPIRIN EC 81 MG PO TBEC: 81.0000 mg | DELAYED_RELEASE_TABLET | Freq: Every day | ORAL | 11 refills | Status: DC

## 2020-04-18 NOTE — Plan of Care (Signed)

## 2020-04-18 NOTE — Telephone Encounter (Signed)
Received a call from [facility] to schedule hospital f/u appt (TOC) as pt is being discharged later today. Pt scheduled with Dr Louanne Skye 04/24/20 at 11:25. He just needs the [facility] call after discharge.

## 2020-04-18 NOTE — Telephone Encounter (Signed)
TRANSITIONAL CARE MANAGEMENT TELEPHONE OUTREACH NOTE   Contact Date: 04/18/2020 Contacted By: Billee Cashing, LPN   DISCHARGE INFORMATION Date of Discharge:04/18/2020 Dis[facility]: [facility] Discharge Diagnosis:Syncope [R55] AKI (acute kidney injury) [facility]) [N17.9] Anaphylaxis, initial encounter [T78.2XXA] Hypotension, unspecified hypotension type [I95.9]   Outpatient Follow Up Recommendations (copied from discharge summary)  1)Avoid ibuprofen/Advil/Aleve/Motrin/Goody Powders/Naproxen/BC powders/Meloxicam/Diclofenac/Indomethacin and other Nonsteroidal anti-inflammatory medications as these will make you more likely to bleed and can cause stomach ulcers, can also cause Kidney problems.   2)You will get a phone call from the [facility] Pulmonology office Dr. Cyril Mourning to schedule your outpatient pulmonary function test/PFT and for follow-up for management of pulmonary fibrosis/interstitial lung disease  3) abstinence from tobacco (dipping and /or chewing tobacco) advised--okay to use over-the-counter nicotine patch to help you quit  4) your kidney function has improved--- please recheck your BMP/kidney and electrolyte test with your primary care physician within a week  5) please stop Diovan/valsartan  6) please check your blood pressures on Mondays Wednesdays and Fridays and keep a diary/log of your blood pressure readings and take this to primary care physician who may decide to adjust your metoprolol based on your blood pressure readings  7) please avoid dehydration  8) please continue incentive spirometry use as advised given your fractured rib right  Christopher Mata is a male primary care patient of Dettinger, Elige Radon, MD. An outgoing telephone call was made today and I spoke with patient.  Christopher Mata condition(s) and treatment(s) were discussed. An opportunity to ask questions was provided and all were answered or forwarded as appropriate.    ACTIVITIES  OF DAILY LIVING  Christopher Mata lives with their spouse and he can perform ADLs independently. his primary caregiver is self. he is able to depend on his primary caregiver(s) for consistent help. Transportation to appointments, to pick up medications, and to run errands is not a problem.  (Consider referral to [facility] if transportation or a consistent caregiver is a problem)   Fall Risk Fall Risk  03/23/2020 05/19/2019  Falls in the past year? 0 0    medium Fall Risk   Home Modifications/Assistive Devices Wheelchair: No Cane: No Ramp: No Bedside Toilet: No Hospital Bed:  No Other:    Home Health Services he is not receiving home health.    MEDICATION RECONCILIATION  Christopher Mata has been able to pick-up all prescribed discharge medications from the pharmacy.   A post discharge medication reconciliation was performed and the complete medication list was reviewed with the patient/caregiver and is current as of 04/18/2020. Changes highlighted below.  Discontinued Medications STOP taking these medications   valsartan 80 MG tablet Commonly known as: Diovan     Current Medication List Allergies as of 04/18/2020      Reactions   Meat [alpha-gal] Rash   Red meat      Medication List    Notice   This visit is during an admission. Changes to the med list made in this visit will be reflected in the After Visit Summary of the admission.    TAKE these medications   acetaminophen 325 MG tablet Commonly known as: Tylenol Take 2 tablets (650 mg total) by mouth every 4 (four) hours as needed for mild pain, fever or headache.   aspirin EC 81 MG tablet Take 1 tablet (81 mg total) by mouth daily with breakfast. What changed: when to take this   atorvastatin 20 MG tablet Commonly known as: LIPITOR  TAKE 1 TABLET BY MOUTH EVERYDAY AT BEDTIME   Dulera 100-5 MCG/ACT Aero Generic drug: mometasone-formoterol Inhale 2 puffs into the lungs in the morning and at bedtime.    fenofibrate 145 MG tablet Commonly known as: TRICOR Take 1 tablet (145 mg total) by mouth daily.   finasteride 5 MG tablet Commonly known as: PROSCAR Take 1 tablet (5 mg total) by mouth daily.   metoprolol tartrate 25 MG tablet Commonly known as: LOPRESSOR Take 1 tablet (25 mg total) by mouth 2 (two) times daily.   omeprazole 20 MG capsule Commonly known as: PRILOSEC Take 1 capsule (20 mg total) by mouth daily. (Needs to be seen before next refill)   sildenafil 20 MG tablet Commonly known as: REVATIO Take 1-3 tablets (20-60 mg total) by mouth as needed.   traMADol 50 MG tablet Commonly known as: Ultram Take 1 tablet (50 mg total) by mouth every 6 (six) hours as needed for moderate pain (Rib cage pain).     PATIENT EDUCATION & FOLLOW-UP PLAN  An appointment for Transitional Care Management is scheduled with Dettinger, Elige Radon, MD on 04/24/2020 at 11:25am.  Take all medications as prescribed  Contact our office by calling [phone] if you have any questions or concerns

## 2020-04-18 NOTE — Progress Notes (Signed)
*  PRELIMINARY RESULTS* Echocardiogram 2D Echocardiogram has been performed.  Jeryl Columbia 04/18/2020, 10:51 AM

## 2020-04-18 NOTE — Discharge Summary (Signed)
Christopher Mata, is a 74 y.o. male  DOB 1946/07/10  MRN [id number].  Admission date:  04/17/2020  Admitting Physician  Cylas Falzone Mariea Clonts, MD  Discharge Date:  04/18/2020   Primary MD  Dettinger, Elige Radon, MD  Recommendations for primary care physician for things to follow:    1)Avoid ibuprofen/Advil/Aleve/Motrin/Goody Powders/Naproxen/BC powders/Meloxicam/Diclofenac/Indomethacin and other Nonsteroidal anti-inflammatory medications as these will make you more likely to bleed and can cause stomach ulcers, can also cause Kidney problems.   2)You will get a phone call from the [facility] Pulmonology office Dr. Cyril Mourning to schedule your outpatient pulmonary function test/PFT and for follow-up for management of pulmonary fibrosis/interstitial lung disease  3) abstinence from tobacco (dipping and /or chewing tobacco) advised--okay to use over-the-counter nicotine patch to help you quit  4) your kidney function has improved--- please recheck your BMP/kidney and electrolyte test with your primary care physician within a week  5) please stop Diovan/valsartan  6) please check your blood pressures on Mondays Wednesdays and Fridays and keep a diary/log of your blood pressure readings and take this to primary care physician who may decide to adjust your metoprolol based on your blood pressure readings  7) please avoid dehydration  8) please continue incentive spirometry use as advised given your fractured rib right  Admission Diagnosis  Syncope and collapse [R55] Syncope [R55] AKI (acute kidney injury) (HCC) [N17.9] Anaphylaxis, initial encounter [T78.2XXA] Hypotension, unspecified hypotension type [I95.9] Syncope, unspecified syncope type [R55]   Discharge Diagnosis  Syncope and collapse [R55] Syncope [R55] AKI (acute kidney injury) (HCC) [N17.9] Anaphylaxis, initial encounter [T78.2XXA] Hypotension,  unspecified hypotension type [I95.9] Syncope, unspecified syncope type [R55]    Principal Problem:   Syncope Active Problems:   Hypertension   Hypotension   ILD (interstitial lung disease)/UIP   Allergic reaction to alpha-gal-- Tick Bite Related   AKI (acute kidney injury) [facility])      Past Medical History:  Diagnosis Date  . Allergy to alpha-gal   . GERD (gastroesophageal reflux disease)   . History of hiatal hernia   . History of nocturia   . Hyperlipidemia   . Hypertension   . Obesity (BMI 30-39.9)   . 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 Seasonal allergies   . Shortness of breath dyspnea    with exertion  . Vertigo     Past Surgical History:  Procedure Laterality Date  . COLONOSCOPY W/ BIOPSIES AND POLYPECTOMY    . ESOPHAGOGASTRODUODENOSCOPY N/A 01/13/2015   Procedure: ESOPHAGOGASTRODUODENOSCOPY (EGD);  Surgeon: Jeani Hawking, MD;  Location: [facility] ENDOSCOPY;  Service: Endoscopy;  Laterality: N/A;  . EYE SURGERY     repaired a detached retina  . HIATAL HERNIA REPAIR  03/17/2015   Procedure: LAPAROSCOPIC REPAIR OF HIATAL HERNIA;  Surgeon: Axel Filler, MD;  Location: [facility] ORS;  Service: General;;  . INSERTION OF MESH  03/17/2015   Procedure: INSERTION OF MESH;  Surgeon: Axel Filler, MD;  Location: [facility] ORS;  Service: General;;     HPI  from the  history and physical done on the day of admission:    Christopher Mata  is a 74 y.o. male with past medical history relevant for nicotine abuse (dips/chews), BPH withLUTs, UIP, HTN, GERD and history of alpha gal type reaction who presents from home after syncope/fall with right-sided chest pain --Work-up in ED reveals right-sided seventh rib fracture on chest x-ray, CT chest without pneumothorax or hemothorax however patient has UIP/ILD.....  --AKI with creatinine up to 2.89 from a baseline of 1.3 and elevated D-dimer noted No fever  Or chills   No Nausea, Vomiting or Diarrhea -In the  ED patient was found to be persistently hypotensive despite IV fluid boluses, lactic acid was elevated, no concerns for sepsis (lactic acidosis is most likely due to dehydration and persistent hypotension with poor tissue perfusion)  Additional history obtained from patient's 2 daughters at bedside  -Troponin is not elevated, leukocytosis noted -UA noted, urine culture pending, blood cultures pending     Hospital Course:    1)Syncope- on  telemetry monitored unit no significant arrhythmias,  troponins and EKG not consistent with ACS , -- echocardiogram with EF of 60 to 65%, w/o significant aortic stenosis or other outflow obstruction,  And no to rule out segmental/Regional wall motion abnormalities.  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-   creatinine on admission=2.89  , baseline creatinine =1.33 (03/23/20), renally adjust medications, avoid nephrotoxic agents / dehydration  / hypotension -Creatinine is down to 1.62 with hydration ---Hold Diovan,  3)UIP/Pulm Fibrosis----bronchodilators as ordered, outpatient follow-up with pulmonologist advised, please see high-resolution CT chest from 04/17/2020 -Patient will need PFTs and other studies with pulmonologist  4) right seventh rib fracture--- in the setting of mechanical fall/syncope, as needed oxycodone -no pneumothorax and no hypoxia  5) nicotine abuse--- patient chews a lot of tobacco/dips----nicotine patch as ordered   6)lactic acidosis---- is most likely due to dehydration with AKI and persistent hypotension with poor tissue perfusion--very low clinical index of suspicion for sepsis --Lactic acidosis resolved with  hydration   Disposition--discharge home, outpatient pulmonology follow-up advised  Dispo: The patient is from: Home  Anticipated d/c is to: Home  Discharge Condition: stable  Follow UP   Follow-up Information    Dettinger, Elige Radon, MD Follow up.   Specialties: Family Medicine, Cardiology Why: Appointment  Contact information: [address] [place] [address] [phone]        Oretha Milch, MD. Schedule an appointment as soon as possible for a visit in 3 week(s).   Specialty: Pulmonary Disease Why: UIP/ILD needs PFT and office appointment in [facility] information: [address] [place] [address] [phone]                Consults obtained - na  Diet and Activity recommendation:  As advised  Discharge Instructions    Discharge Instructions    Call MD for:  difficulty breathing, headache or visual disturbances   Complete by: As directed    Call MD for:  persistant dizziness or light-headedness   Complete by: As directed    Call MD for:  persistant nausea and vomiting   Complete by: As directed    Call MD for:  severe uncontrolled pain   Complete by: As directed  Call MD for:  temperature >100.4   Complete by: As directed    Diet - low sodium heart healthy   Complete by: As directed    Discharge instructions   Complete by: As directed    1)Avoid ibuprofen/Advil/Aleve/Motrin/Goody Powders/Naproxen/BC powders/Meloxicam/Diclofenac/Indomethacin and other Nonsteroidal anti-inflammatory medications as these will make you more likely to bleed and can cause stomach ulcers, can also cause Kidney problems.   2)You will get a phone call from the [facility] Pulmonology office Dr. Cyril Mourning to schedule your outpatient pulmonary function test/PFT and for follow-up for management of pulmonary fibrosis/interstitial lung disease  3) abstinence from tobacco (dipping and /or chewing tobacco) advised--okay to use over-the-counter  nicotine patch to help you quit  4) your kidney function has improved--- please recheck your BMP/kidney and electrolyte test with your primary care physician within a week  5) please stop Diovan/valsartan  6) please check your blood pressures on Mondays Wednesdays and Fridays and keep a diary/log of your blood pressure readings and take this to primary care physician who may decide to adjust your metoprolol based on your blood pressure readings  7) please avoid dehydration  8) please continue incentive spirometry use as advised given your fractured rib right   Increase activity slowly   Complete by: As directed         Discharge Medications     Allergies as of 04/18/2020      Reactions   Meat [alpha-gal] Rash   Red meat      Medication List    STOP taking these medications   valsartan 80 MG tablet Commonly known as: Diovan     TAKE these medications   acetaminophen 325 MG tablet Commonly known as: Tylenol Take 2 tablets (650 mg total) by mouth every 4 (four) hours as needed for mild pain, fever or headache.   aspirin EC 81 MG tablet Take 1 tablet (81 mg total) by mouth daily with breakfast. What changed: when to take this   atorvastatin 20 MG tablet Commonly known as: LIPITOR TAKE 1 TABLET BY MOUTH EVERYDAY AT BEDTIME   Dulera 100-5 MCG/ACT Aero Generic drug: mometasone-formoterol Inhale 2 puffs into the lungs in the morning and at bedtime.   fenofibrate 145 MG tablet Commonly known as: TRICOR Take 1 tablet (145 mg total) by mouth daily.   finasteride 5 MG tablet Commonly known as: PROSCAR Take 1 tablet (5 mg total) by mouth daily.   metoprolol tartrate 25 MG tablet Commonly known as: LOPRESSOR Take 1 tablet (25 mg total) by mouth 2 (two) times daily.   omeprazole 20 MG capsule Commonly known as: PRILOSEC Take 1 capsule (20 mg total) by mouth daily. (Needs to be seen before next refill)   sildenafil 20 MG tablet Commonly known as: REVATIO Take 1-3  tablets (20-60 mg total) by mouth as needed.   traMADol 50 MG tablet Commonly known as: Ultram Take 1 tablet (50 mg total) by mouth every 6 (six) hours as needed for moderate pain (Rib cage pain).       Major procedures and Radiology Reports - PLEASE review detailed and final reports for all details, in brief -    DG Chest 2 View  Result Date: 03/23/2020 CLINICAL DATA:  73 year old male with history of COPD. EXAM: CHEST - 2 VIEW COMPARISON:  Chest x-ray 12/05/2016. FINDINGS: Lung volumes are low. Widespread peripheral predominant areas of interstitial prominence in the lungs bilaterally, most evident throughout the mid to lower lungs, particularly in the left lung base,  increased compared to the prior study. No confluent consolidative airspace disease. No pleural effusions. No evidence of pulmonary edema. Heart size is normal. Upper mediastinal contours are within normal limits. Aortic atherosclerosis. IMPRESSION: 1. The appearance the chest is most suggestive of interstitial lung disease. Outpatient referral to pulmonology for further evaluation is strongly recommended. Additionally, follow-up high-resolution chest CT is suggested to better evaluate these findings. 2. Aortic atherosclerosis. Electronically Signed   By: Trudie Reedaniel  Entrikin M.D.   On: 03/23/2020 15:57   DG Ribs Unilateral W/Chest Right  Result Date: 04/17/2020 CLINICAL DATA:  Right rib pain. EXAM: RIGHT RIBS AND CHEST - 3+ VIEW COMPARISON:  Chest x-ray 03/23/2020 FINDINGS: No evidence for pneumothorax. Peripheral chronic interstitial opacity is again noted, left greater than right and similar to prior. No pulmonary edema. No evidence for new focal airspace consolidation. No pleural effusion. Oblique views of the right ribs show acute fracture of the lateral seventh rib and potentially anterior eighth rib. IMPRESSION: 1. Acute fracture of the right seventh and probably right anterior eighth ribs. 2. Chronic interstitial lung disease. No  new or progressive findings. Electronically Signed   By: Kennith CenterEric  Mansell M.D.   On: 04/17/2020 09:50   CT Chest High Resolution  Result Date: 04/17/2020 CLINICAL DATA:  Inpatient. Right rib pain, patient woke up on floor. Right rib fractures on radiographs from earlier today. EXAM: CT CHEST WITHOUT CONTRAST TECHNIQUE: Multidetector CT imaging of the chest was performed following the standard protocol without intravenous contrast. High resolution imaging of the lungs, as well as inspiratory and expiratory imaging, was performed. COMPARISON:  04/17/2020 chest and right rib radiographs. 03/27/2015 chest CT angiogram. FINDINGS: Cardiovascular: Normal heart size. No significant pericardial effusion/thickening. Left anterior descending and right coronary atherosclerosis. Atherosclerotic nonaneurysmal thoracic aorta. Normal caliber pulmonary arteries. Mediastinum/Nodes: No discrete thyroid nodules. Unremarkable esophagus. No pathologically enlarged axillary, mediastinal or hilar lymph nodes, noting limited sensitivity for the detection of hilar adenopathy on this noncontrast study. Lungs/Pleura: No pneumothorax. No pleural effusion. Mild centrilobular emphysema with mild diffuse wall thickening. No acute consolidative airspace disease, lung masses or significant pulmonary nodules. There is patchy moderate confluent subpleural reticulation and minimal ground-glass opacity throughout both lungs with associated mild traction bronchiectasis and architectural distortion. There is a slight basilar predominance to these findings. Mild honeycombing is scattered in both lungs, most prominent in anterior lingula and basilar left lower lobe. These findings have progressed since 04/04/2015 chest CT. Upper abdomen: History of hiatal hernia repair with probable small recurrent hiatal hernia. Stable peripherally calcified 3.6 cm cystic lesion in the superior spleen, considered benign. Musculoskeletal: No aggressive appearing focal  osseous lesions. Acute nondisplaced anterolateral right seventh rib fracture. No additional fractures. Mild thoracic spondylosis. Mild bilateral gynecomastia, asymmetric to the right, slightly increased. IMPRESSION: 1. Acute nondisplaced anterolateral right seventh rib fracture. No pneumothorax. 2. Spectrum of findings compatible with basilar predominant fibrotic interstitial lung disease with mild honeycombing, progressed since 04/04/2015 chest CT. Findings are consistent with UIP per consensus guidelines: Diagnosis of Idiopathic Pulmonary Fibrosis: An Official ATS/ERS/JRS/ALAT Clinical Practice Guideline. Am Rosezetta SchlatterJ Respir Crit Care Med Vol 198, Iss 5, [id number], Jun 07 2017. 3. Two-vessel coronary atherosclerosis. 4. Status post hiatal hernia repair with probable small recurrent hiatal hernia. 5. Aortic Atherosclerosis (ICD10-I70.0) and Emphysema (ICD10-J43.9). Electronically Signed   By: Delbert PhenixJason A Poff M.D.   On: 04/17/2020 16:31   US Carotid Bilateral  Result Date: 04/17/2020 CLINICAL DATA:  Syncope, fall, hypertension and hyperlipidemia. EXAM: BILATERAL CAROTID DUPLEX ULTRASOUND TECHNIQUE: Wallace CullensGray scale imaging,  color Doppler and duplex ultrasound were performed of bilateral carotid and vertebral arteries in the neck. COMPARISON:  None. FINDINGS: Criteria: Quantification of carotid stenosis is based on velocity parameters that correlate the residual internal carotid diameter with NASCET-based stenosis levels, using the diameter of the distal internal carotid lumen as the denominator for stenosis measurement. The following velocity measurements were obtained: RIGHT ICA:  105/21 cm/sec CCA:  107/18 cm/sec SYSTOLIC ICA/CCA RATIO:  1.0 ECA:  124 cm/sec LEFT ICA:  108/29 cm/sec CCA:  100/14 cm/sec SYSTOLIC ICA/CCA RATIO:  1.1 ECA:  26 cm/sec RIGHT CAROTID ARTERY: There is a mild to moderate amount of partially calcified plaque at the level of the carotid bulb and extending into the right ICA origin. Estimated right ICA  stenosis is less than 50%. RIGHT VERTEBRAL ARTERY: Antegrade flow with normal waveform and velocity. LEFT CAROTID ARTERY: Mild amount of partially calcified plaque is present at the level of the carotid bulb and proximal left ICA. Estimated left ICA stenosis is less than 50%. LEFT VERTEBRAL ARTERY: Antegrade flow with normal waveform and velocity. IMPRESSION: Partially calcified plaque at the level of both carotid bulbs and proximal internal carotid arteries, right greater than left. No significant carotid stenosis identified with estimated bilateral ICA stenoses of less than 50%. Electronically Signed   By: Irish Lack M.D.   On: 04/17/2020 16:31   ECHOCARDIOGRAM COMPLETE  Result Date: 04/18/2020    ECHOCARDIOGRAM REPORT   Patient Name:   Christopher Mata Date of Exam: 04/18/2020 Medical Rec #:  [id number]       Height:       70.0 in Accession #:    [id number]      Weight:       220.0 lb Date of Birth:  1946/09/07      BSA:          2.174 m Patient Age:    73 years        BP:           111/84 mmHg Patient Gender: M               HR:           70 bpm. Exam [facility]:  [facility] Procedure: 2D Echo Indications:    Dyspnea 786.09 / R06.00  History:        Patient has prior history of Echocardiogram examinations, most                 recent 12/18/2016. Signs/Symptoms:Syncope; Risk                 Factors:Hypertension, Dyslipidemia and Former Smoker. GERD, AKI                 (acute kidney injury.  Sonographer:    Jeryl Columbia RDCS (AE) Referring Phys: [id number] Zeidy Tayag IMPRESSIONS  1. Left ventricular ejection fraction, by estimation, is 60 to 65%. The left ventricle has normal function. The left ventricle has no regional wall motion abnormalities. There is mild left ventricular hypertrophy. Left ventricular diastolic parameters are indeterminate.  2. Right ventricular systolic function is normal. The right ventricular size is normal. There is moderately elevated pulmonary artery systolic pressure.  3. Left  atrial size was mildly dilated.  4. The mitral valve is normal in structure. No evidence of mitral valve regurgitation. No evidence of mitral stenosis.  5. The aortic valve is tricuspid. Aortic valve regurgitation is not visualized. No aortic stenosis is present.  6. The inferior  vena cava is normal in size with greater than 50% respiratory variability, suggesting right atrial pressure of 3 mmHg. FINDINGS  Left Ventricle: Left ventricular ejection fraction, by estimation, is 60 to 65%. The left ventricle has normal function. The left ventricle has no regional wall motion abnormalities. The left ventricular internal cavity size was normal in size. There is  mild left ventricular hypertrophy. Left ventricular diastolic parameters are indeterminate. Right Ventricle: The right ventricular size is normal. No increase in right ventricular wall thickness. Right ventricular systolic function is normal. There is moderately elevated pulmonary artery systolic pressure. The tricuspid regurgitant velocity is 3.08 m/s, and with an assumed right atrial pressure of 10 mmHg, the estimated right ventricular systolic pressure is 47.9 mmHg. Left Atrium: Left atrial size was mildly dilated. Right Atrium: Right atrial size was normal in size. Pericardium: There is no evidence of pericardial effusion. Mitral Valve: The mitral valve is normal in structure. No evidence of mitral valve regurgitation. No evidence of mitral valve stenosis. Tricuspid Valve: The tricuspid valve is normal in structure. Tricuspid valve regurgitation is mild . No evidence of tricuspid stenosis. Aortic Valve: The aortic valve is tricuspid. Aortic valve regurgitation is not visualized. No aortic stenosis is present. Aortic valve mean gradient measures 3.6 mmHg. Aortic valve peak gradient measures 6.8 mmHg. Aortic valve area, by VTI measures 2.73 cm. Pulmonic Valve: The pulmonic valve was not well visualized. Pulmonic valve regurgitation is not visualized. No  evidence of pulmonic stenosis. Aorta: The aortic root is normal in size and structure. Pulmonary Artery: Indeterminant PASP, IVC is not visualized. Venous: The inferior vena cava is normal in size with greater than 50% respiratory variability, suggesting right atrial pressure of 3 mmHg. IAS/Shunts: The interatrial septum was not well visualized.  LEFT VENTRICLE PLAX 2D LVIDd:         3.96 cm  Diastology LVIDs:         1.87 cm  LV e' lateral:   6.42 cm/s LV PW:         1.10 cm  LV E/e' lateral: 15.0 LV IVS:        1.13 cm  LV e' medial:    5.33 cm/s LVOT diam:     2.10 cm  LV E/e' medial:  18.0 LV SV:         81 LV SV Index:   37 LVOT Area:     3.46 cm  RIGHT VENTRICLE RV S prime:     12.70 cm/s TAPSE (M-mode): 2.7 cm LEFT ATRIUM             Index LA diam:        3.90 cm 1.79 cm/m LA Vol (A2C):   41.6 ml 19.14 ml/m LA Vol (A4C):   64.3 ml 29.58 ml/m LA Biplane Vol: 56.0 ml 25.76 ml/m  AORTIC VALVE AV Area (Vmax):    2.82 cm AV Area (Vmean):   2.64 cm AV Area (VTI):     2.73 cm AV Vmax:           130.31 cm/s AV Vmean:          87.637 cm/s AV VTI:            0.296 m AV Peak Grad:      6.8 mmHg AV Mean Grad:      3.6 mmHg LVOT Vmax:         106.26 cm/s LVOT Vmean:        66.923 cm/s LVOT VTI:  0.233 m LVOT/AV VTI ratio: 0.79  AORTA Ao Root diam: 3.30 cm MITRAL VALVE               TRICUSPID VALVE MV Area (PHT): 3.30 cm    TR Peak grad:   37.9 mmHg MV Decel Time: 230 msec    TR Vmax:        308.00 cm/s MV E velocity: 96.00 cm/s MV A velocity: 95.50 cm/s  SHUNTS MV E/A ratio:  1.01        Systemic VTI:  0.23 m                            Systemic Diam: 2.10 cm Dina Rich MD Electronically signed by Dina Rich MD Signature Date/Time: 04/18/2020/11:04:41 AM    Final     Micro Results    Recent Results (from the past 240 hour(s))  SARS Coronavirus 2 by RT PCR (hospital order, performed in [facility] hospital lab) Nasopharyngeal Nasopharyngeal Swab     Status: None   Collection Time: 04/17/20  11:53 AM   Specimen: Nasopharyngeal Swab  Result Value Ref Range Status   SARS Coronavirus 2 NEGATIVE NEGATIVE Final    Comment: (NOTE) SARS-CoV-2 target nucleic acids are NOT DETECTED.  The SARS-CoV-2 RNA is generally detectable in upper and lower respiratory specimens during the acute phase of infection. The lowest concentration of SARS-CoV-2 viral copies this assay can detect is 250 copies / mL. A negative result does not preclude SARS-CoV-2 infection and should not be used as the sole basis for treatment or other patient management decisions.  A negative result may occur with improper specimen collection / handling, submission of specimen other than nasopharyngeal swab, presence of viral mutation(s) within the areas targeted by this assay, and inadequate number of viral copies (<250 copies / mL). A negative result must be combined with clinical observations, patient history, and epidemiological information.  Fact Sheet for Patients:   BoilerBrush.com.cy  Fact Sheet for Healthcare Providers: https://pope.com/  This test is not yet approved or  cleared by the [place] FDA and has been authorized for detection and/or diagnosis of SARS-CoV-2 by FDA under an Emergency Use Authorization (EUA).  This EUA will remain in effect (meaning this test can be used) for the duration of the COVID-19 declaration under Section 564(b)(1) of the Act, 21 U.S.C. section 360bbb-3(b)(1), unless the authorization is terminated or revoked sooner.  Performed at [facility], [address] [place], [place] [address]   Blood culture (routine x 2)     Status: None (Preliminary result)   Collection Time: 04/17/20 12:53 PM   Specimen: Right Antecubital; Blood  Result Value Ref Range Status   Specimen Description RIGHT ANTECUBITAL  Final   Special Requests   Final    BOTTLES DRAWN AEROBIC AND ANAEROBIC Blood Culture adequate volume   Culture   Final     NO GROWTH < 24 HOURS Performed at [facility], [address] [place], [place] [address]    Report Status PENDING  Incomplete  Blood culture (routine x 2)     Status: None (Preliminary result)   Collection Time: 04/17/20 12:53 PM   Specimen: BLOOD RIGHT HAND  Result Value Ref Range Status   Specimen Description BLOOD RIGHT HAND  Final   Special Requests   Final    BOTTLES DRAWN AEROBIC AND ANAEROBIC Blood Culture adequate volume   Culture   Final    NO GROWTH <  24 HOURS Performed at [facility], [address] [place], [place] [address]    Report Status PENDING  Incomplete       Today   Subjective    Christopher Mata today has no new complaints No fever  Or chills   No Nausea, Vomiting or Diarrhea --No chest pains, no palpitations, no dizziness, -Family at bedside bedside, states patient back to baseline        Patient has been seen and examined prior to discharge   Objective   Blood pressure 111/84, pulse 70, temperature 98 F (36.7 C), temperature source Oral, resp. rate 20, height 5\' 10"  (1.778 m), weight 99.8 kg, SpO2 98 %.   Intake/Output Summary (Last 24 hours) at 04/18/2020 1236 Last data filed at 04/18/2020 0314 Gross per 24 hour  Intake 2994.59 ml  Output 845 ml  Net 2149.59 ml    Exam Gen:- Awake Alert, no acute distress  HEENT:- [place].AT, No sclera icterus Neck-Supple Neck,No JVD,.  Lungs-  -Velcro type brace on auscultation bilaterally, symmetrical air movement,  CV- S1, S2 normal, regular Abd-  +ve B.Sounds, Abd Soft, No tenderness,    Extremity/Skin:- No  edema,   good pulses Psych-affect is appropriate, oriented x3 Neuro-no new focal deficits, no tremors    Data Review   CBC w Diff:  Lab Results  Component Value Date   WBC 14.3 (H) 04/18/2020   HGB 12.3 (L) 04/18/2020   HGB 14.6 04/17/2018   HCT 38.5 (L) 04/18/2020   HCT 43.8 04/17/2018   PLT 214 04/18/2020   PLT 292 04/17/2018   LYMPHOPCT 10 04/17/2020   MONOPCT 6 04/17/2020    EOSPCT 0 04/17/2020   BASOPCT 0 04/17/2020    CMP:  Lab Results  Component Value Date   NA 137 04/18/2020   NA 140 03/23/2020   K 3.8 04/18/2020   CL 114 (H) 04/18/2020   CO2 18 (L) 04/18/2020   BUN 23 04/18/2020   BUN 16 03/23/2020   CREATININE 1.62 (H) 04/18/2020   PROT 7.3 03/23/2020   ALBUMIN 3.8 03/23/2020   BILITOT 0.3 03/23/2020   ALKPHOS 52 03/23/2020   AST 15 03/23/2020   ALT 12 03/23/2020  .   Total Discharge time is about 33 minutes  03/25/2020 M.D on 04/18/2020 at 12:36 PM  Go to [email or web address] -  for contact info  [facility] - Office  [phone]

## 2020-04-18 NOTE — Discharge Instructions (Signed)
1)Avoid ibuprofen/Advil/Aleve/Motrin/Goody Powders/Naproxen/BC powders/Meloxicam/Diclofenac/Indomethacin and other Nonsteroidal anti-inflammatory medications as these will make you more likely to bleed and can cause stomach ulcers, can also cause Kidney problems.   2)You will get a phone call from the [facility] Pulmonology office Dr. Cyril Mourning to schedule your outpatient pulmonary function test/PFT and for follow-up for management of pulmonary fibrosis/interstitial lung disease  3) abstinence from tobacco (dipping and /or chewing tobacco) advised--okay to use over-the-counter nicotine patch to help you quit  4) your kidney function has improved--- please recheck your BMP/kidney and electrolyte test with your primary care physician within a week  5) please stop Diovan/valsartan  6) please check your blood pressures on Mondays Wednesdays and Fridays and keep a diary/log of your blood pressure readings and take this to primary care physician who may decide to adjust your metoprolol based on your blood pressure readings  7) please avoid dehydration  8) please continue incentive spirometry use as advised given your fractured rib right

## 2020-04-18 NOTE — Progress Notes (Signed)
Nsg Discharge Note  Admit Date:  04/17/2020 Discharge date: 04/18/2020   Roddie Mc to be D/C'd Home per MD order.  AVS completed.  Copy for chart, and copy for patient signed, and dated. Patient/caregiver able to verbalize understanding.  Discharge Medication: Allergies as of 04/18/2020      Reactions   Meat [alpha-gal] Rash   Red meat      Medication List    STOP taking these medications   valsartan 80 MG tablet Commonly known as: Diovan     TAKE these medications   acetaminophen 325 MG tablet Commonly known as: Tylenol Take 2 tablets (650 mg total) by mouth every 4 (four) hours as needed for mild pain, fever or headache.   aspirin EC 81 MG tablet Take 1 tablet (81 mg total) by mouth daily with breakfast. What changed: when to take this   atorvastatin 20 MG tablet Commonly known as: LIPITOR TAKE 1 TABLET BY MOUTH EVERYDAY AT BEDTIME   Dulera 100-5 MCG/ACT Aero Generic drug: mometasone-formoterol Inhale 2 puffs into the lungs in the morning and at bedtime.   fenofibrate 145 MG tablet Commonly known as: TRICOR Take 1 tablet (145 mg total) by mouth daily.   finasteride 5 MG tablet Commonly known as: PROSCAR Take 1 tablet (5 mg total) by mouth daily.   metoprolol tartrate 25 MG tablet Commonly known as: LOPRESSOR Take 1 tablet (25 mg total) by mouth 2 (two) times daily.   omeprazole 20 MG capsule Commonly known as: PRILOSEC Take 1 capsule (20 mg total) by mouth daily. (Needs to be seen before next refill)   sildenafil 20 MG tablet Commonly known as: REVATIO Take 1-3 tablets (20-60 mg total) by mouth as needed.   traMADol 50 MG tablet Commonly known as: Ultram Take 1 tablet (50 mg total) by mouth every 6 (six) hours as needed for moderate pain (Rib cage pain).       Discharge Assessment: Vitals:   04/18/20 0203 04/18/20 0555  BP: 103/73 111/84  Pulse: 68 70  Resp: 20 20  Temp: 98 F (36.7 C) 98 F (36.7 C)  SpO2: 95% 98%   Skin clean, dry and  intact without evidence of skin break down, no evidence of skin tears noted. IV catheter discontinued intact. Site without signs and symptoms of complications - no redness or edema noted at insertion site, patient denies c/o pain - only slight tenderness at site.  Dressing with slight pressure applied.  D/c Instructions-Education: Discharge instructions given to patient/family with verbalized understanding. D/c education completed with patient/family including follow up instructions, medication list, d/c activities limitations if indicated, with other d/c instructions as indicated by MD - patient able to verbalize understanding, all questions fully answered. Patient instructed to return to ED, call 911, or call MD for any changes in condition.  Patient escorted via WC, and D/C home via private auto.  Brandy Hale, LPN 11/18/9415 4:08 PM

## 2020-04-18 NOTE — Progress Notes (Signed)
Nsg Discharge Note  Admit Date:  04/17/2020 Discharge date: 04/18/2020   Roddie Mc to be D/C'd home per MD order.  AVS completed. Patient/caregiver able to verbalize understanding.  Discharge Medication: Allergies as of 04/18/2020      Reactions   Meat [alpha-gal] Rash   Red meat      Medication List    STOP taking these medications   valsartan 80 MG tablet Commonly known as: Diovan     TAKE these medications   acetaminophen 325 MG tablet Commonly known as: Tylenol Take 2 tablets (650 mg total) by mouth every 4 (four) hours as needed for mild pain, fever or headache.   aspirin EC 81 MG tablet Take 1 tablet (81 mg total) by mouth daily with breakfast. What changed: when to take this   atorvastatin 20 MG tablet Commonly known as: LIPITOR TAKE 1 TABLET BY MOUTH EVERYDAY AT BEDTIME   Dulera 100-5 MCG/ACT Aero Generic drug: mometasone-formoterol Inhale 2 puffs into the lungs in the morning and at bedtime.   fenofibrate 145 MG tablet Commonly known as: TRICOR Take 1 tablet (145 mg total) by mouth daily.   finasteride 5 MG tablet Commonly known as: PROSCAR Take 1 tablet (5 mg total) by mouth daily.   metoprolol tartrate 25 MG tablet Commonly known as: LOPRESSOR Take 1 tablet (25 mg total) by mouth 2 (two) times daily.   omeprazole 20 MG capsule Commonly known as: PRILOSEC Take 1 capsule (20 mg total) by mouth daily. (Needs to be seen before next refill)   sildenafil 20 MG tablet Commonly known as: REVATIO Take 1-3 tablets (20-60 mg total) by mouth as needed.   traMADol 50 MG tablet Commonly known as: Ultram Take 1 tablet (50 mg total) by mouth every 6 (six) hours as needed for moderate pain (Rib cage pain).       Discharge Assessment: Vitals:   04/18/20 0203 04/18/20 0555  BP: 103/73 111/84  Pulse: 68 70  Resp: 20 20  Temp: 98 F (36.7 C) 98 F (36.7 C)  SpO2: 95% 98%   Skin clean, dry and intact without evidence of skin break down, no evidence  of skin tears noted. IV catheter discontinued intact. Site without signs and symptoms of complications - no redness or edema noted at insertion site, patient denies c/o pain - only slight tenderness at site.  Dressing with slight pressure applied.  D/c Instructions-Education: Discharge instructions given to patient/family with verbalized understanding. D/c education completed with patient/family including follow up instructions, medication list, d/c activities limitations if indicated, with other d/c instructions as indicated by MD - patient able to verbalize understanding, all questions fully answered. Patient instructed to return to ED, call 911, or call MD for any changes in condition.  Patient escorted via WC, and D/C home via private auto.  Kizzie Bane, RN 04/18/2020 1:03 PM

## 2020-04-19 LAB — URINE CULTURE: Culture: NO GROWTH

## 2020-04-22 LAB — CULTURE, BLOOD (ROUTINE X 2)
Culture: NO GROWTH
Culture: NO GROWTH
Special Requests: ADEQUATE
Special Requests: ADEQUATE

## 2020-04-24 ENCOUNTER — Ambulatory Visit (INDEPENDENT_AMBULATORY_CARE_PROVIDER_SITE_OTHER): Payer: Medicare Other | Admitting: Family Medicine

## 2020-04-24 ENCOUNTER — Encounter: Payer: Self-pay | Admitting: Family Medicine

## 2020-04-24 VITALS — BP 127/82 | HR 87 | Temp 97.2°F | Ht 70.0 in | Wt 215.0 lb

## 2020-04-24 DIAGNOSIS — R55 Syncope and collapse: Secondary | ICD-10-CM

## 2020-04-24 DIAGNOSIS — I952 Hypotension due to drugs: Secondary | ICD-10-CM | POA: Diagnosis not present

## 2020-04-24 NOTE — Progress Notes (Signed)
BP 127/82   Pulse 87   Temp (!) 97.2 F (36.2 C)   Ht 5' 10"  (1.778 m)   Wt 215 lb (97.5 kg)   SpO2 96%   BMI 30.85 kg/m    Subjective:   Patient ID: Christopher Mata, male    DOB: April 06, 1946, 74 y.o.   MRN: [id number]  HPI: Christopher Mata is a 74 y.o. male presenting on 04/24/2020 for Hospitalization Follow-up (hypotension)   HPI [place] OUTREACH NOTE   Contact Date: 04/18/2020 Contacted By: Lynnea Ferrier, LPN   DISCHARGE INFORMATION Date of Discharge:04/18/2020 Discharge Facility: [facility] Discharge Diagnosis:Syncope [R55] AKI (acute kidney injury) ([place]) [N17.9] Anaphylaxis, initial encounter [T78.2XXA] Hypotension, unspecified hypotension type [I95.9]  Patient is coming in today for hospital follow-up, he he was seen in [facility] for syncope.  He had one syncopal episode that he thinks may have been related to eating something with alpha gal.  He does admit that he had a rash at the same time. Patient has not had any further episodes.  Patient has been diagnosed with possible pulmonary fibrosis we will try to do a referral for for pulmonology but he never saw them and they tried to do another one apparently from the ER to Dr. Elsworth Soho.  He is also been having some hypotension and they told him to stop his blood pressure pill and he has not taken it since and his blood pressures are running good.  Patient had an extensive cardiac work-up and echocardiogram and everything looks good from the ER.  Relevant past medical, surgical, family and social history reviewed and updated as indicated. Interim medical history since our last visit reviewed. Allergies and medications reviewed and updated.  Review of Systems  Constitutional: Negative for chills and fever.  Respiratory: Negative for shortness of breath and wheezing.   Cardiovascular: Negative for chest pain and leg swelling.  Gastrointestinal: Negative for abdominal pain.    Musculoskeletal: Negative for back pain and gait problem.  Skin: Negative for rash.  Neurological: Positive for dizziness, syncope and light-headedness. Negative for speech difficulty and weakness.  All other systems reviewed and are negative.   Per HPI unless specifically indicated above   Allergies as of 04/24/2020      Reactions   Meat [alpha-gal] Rash   Red meat      Medication List       Accurate as of April 24, 2020 11:56 AM. If you have any questions, ask your nurse or doctor.        STOP taking these medications   Dulera 100-5 MCG/ACT Aero Generic drug: mometasone-formoterol Stopped by: Fransisca Kaufmann Leonell Lobdell, MD     TAKE these medications   acetaminophen 325 MG tablet Commonly known as: Tylenol Take 2 tablets (650 mg total) by mouth every 4 (four) hours as needed for mild pain, fever or headache.   aspirin EC 81 MG tablet Take 1 tablet (81 mg total) by mouth daily with breakfast.   atorvastatin 20 MG tablet Commonly known as: LIPITOR TAKE 1 TABLET BY MOUTH EVERYDAY AT BEDTIME   fenofibrate 145 MG tablet Commonly known as: TRICOR Take 1 tablet (145 mg total) by mouth daily.   finasteride 5 MG tablet Commonly known as: PROSCAR Take 1 tablet (5 mg total) by mouth daily.   metoprolol tartrate 25 MG tablet Commonly known as: LOPRESSOR Take 1 tablet (25 mg total) by mouth 2 (two) times daily.   omeprazole 20 MG capsule Commonly known  as: PRILOSEC Take 1 capsule (20 mg total) by mouth daily. (Needs to be seen before next refill)   sildenafil 20 MG tablet Commonly known as: REVATIO Take 1-3 tablets (20-60 mg total) by mouth as needed.   traMADol 50 MG tablet Commonly known as: Ultram Take 1 tablet (50 mg total) by mouth every 6 (six) hours as needed for moderate pain (Rib cage pain).        Objective:   BP 127/82   Pulse 87   Temp (!) 97.2 F (36.2 C)   Ht 5' 10"  (1.778 m)   Wt 215 lb (97.5 kg)   SpO2 96%   BMI 30.85 kg/m   Wt Readings from  Last 3 Encounters:  04/24/20 215 lb (97.5 kg)  04/17/20 220 lb (99.8 kg)  03/23/20 217 lb 6.4 oz (98.6 kg)    Physical Exam Vitals and nursing note reviewed.  Constitutional:      General: He is not in acute distress.    Appearance: He is well-developed. He is not diaphoretic.  Eyes:     General: No scleral icterus.    Conjunctiva/sclera: Conjunctivae normal.  Neck:     Thyroid: No thyromegaly.  Cardiovascular:     Rate and Rhythm: Normal rate and regular rhythm.     Heart sounds: Normal heart sounds. No murmur heard.   Pulmonary:     Effort: Pulmonary effort is normal. No respiratory distress.     Breath sounds: Normal breath sounds. No wheezing.  Skin:    General: Skin is warm and dry.     Findings: No rash.  Neurological:     Mental Status: He is alert and oriented to person, place, and time.     Coordination: Coordination normal.  Psychiatric:        Behavior: Behavior normal.       Assessment & Plan:   Problem List Items Addressed This Visit      Cardiovascular and Mediastinum   Hypotension    Other Visit Diagnoses    Syncope and collapse    -  Primary   Relevant Orders   CBC with Differential/Platelet   CMP14+EGFR      Patient has no further episodes, will make sure he gets to pulmonology, may have been due to alpha gal because he also had a rash at the time he says. Follow up plan: Return if symptoms worsen or fail to improve.  Counseling provided for all of the vaccine components Orders Placed This Encounter  Procedures  . CBC with Differential/Platelet  . [place], MD [facility] 04/24/2020, 11:56 AM

## 2020-04-25 LAB — CMP14+EGFR
ALT: 15 IU/L (ref 0–44)
AST: 21 IU/L (ref 0–40)
Albumin/Globulin Ratio: 1.1 — ABNORMAL LOW (ref 1.2–2.2)
Albumin: 3.6 g/dL — ABNORMAL LOW (ref 3.7–4.7)
Alkaline Phosphatase: 50 IU/L (ref 48–121)
BUN/Creatinine Ratio: 9 — ABNORMAL LOW (ref 10–24)
BUN: 12 mg/dL (ref 8–27)
Bilirubin Total: 0.4 mg/dL (ref 0.0–1.2)
CO2: 23 mmol/L (ref 20–29)
Calcium: 8.9 mg/dL (ref 8.6–10.2)
Chloride: 102 mmol/L (ref 96–106)
Creatinine, Ser: 1.39 mg/dL — ABNORMAL HIGH (ref 0.76–1.27)
GFR calc Af Amer: 58 mL/min/{1.73_m2} — ABNORMAL LOW (ref >59)
GFR calc non Af Amer: 50 mL/min/{1.73_m2} — ABNORMAL LOW (ref >59)
Globulin, Total: 3.2 g/dL (ref 1.5–4.5)
Glucose: 113 mg/dL — ABNORMAL HIGH (ref 65–99)
Potassium: 4.1 mmol/L (ref 3.5–5.2)
Sodium: 137 mmol/L (ref 134–144)
Total Protein: 6.8 g/dL (ref 6.0–8.5)

## 2020-04-25 LAB — CBC WITH DIFFERENTIAL/PLATELET
Basophils Absolute: 0 10*3/uL (ref 0.0–0.2)
Basos: 0 %
EOS (ABSOLUTE): 0.2 10*3/uL (ref 0.0–0.4)
Eos: 2 %
Hematocrit: 44.8 % (ref 37.5–51.0)
Hemoglobin: 15 g/dL (ref 13.0–17.7)
Immature Grans (Abs): 0.1 10*3/uL (ref 0.0–0.1)
Immature Granulocytes: 1 %
Lymphocytes Absolute: 1.8 10*3/uL (ref 0.7–3.1)
Lymphs: 18 %
MCH: 29.4 pg (ref 26.6–33.0)
MCHC: 33.5 g/dL (ref 31.5–35.7)
MCV: 88 fL (ref 79–97)
Monocytes Absolute: 0.8 10*3/uL (ref 0.1–0.9)
Monocytes: 8 %
Neutrophils Absolute: 7.2 10*3/uL — ABNORMAL HIGH (ref 1.4–7.0)
Neutrophils: 71 %
Platelets: 273 10*3/uL (ref 150–450)
RBC: 5.11 x10E6/uL (ref 4.14–5.80)
RDW: 12.9 % (ref 11.6–15.4)
WBC: 10.2 10*3/uL (ref 3.4–10.8)

## 2020-05-07 ENCOUNTER — Other Ambulatory Visit: Payer: Self-pay | Admitting: Family Medicine

## 2020-05-18 ENCOUNTER — Other Ambulatory Visit: Payer: Self-pay | Admitting: Family Medicine

## 2020-05-19 ENCOUNTER — Other Ambulatory Visit: Payer: Self-pay

## 2020-05-19 DIAGNOSIS — J849 Interstitial pulmonary disease, unspecified: Secondary | ICD-10-CM

## 2020-05-19 NOTE — Addendum Note (Signed)
Addended by: Benjie Karvonen R on: 05/19/2020 01:40 PM   Modules accepted: Orders

## 2020-06-02 ENCOUNTER — Other Ambulatory Visit: Payer: Self-pay

## 2020-06-02 ENCOUNTER — Ambulatory Visit (INDEPENDENT_AMBULATORY_CARE_PROVIDER_SITE_OTHER): Payer: Medicare Other | Admitting: Pulmonary Disease

## 2020-06-02 DIAGNOSIS — J849 Interstitial pulmonary disease, unspecified: Secondary | ICD-10-CM

## 2020-06-02 LAB — PULMONARY FUNCTION TEST
DL/VA % pred: 90 %
DL/VA: 3.61 ml/min/mmHg/L
DLCO cor % pred: 52 %
DLCO cor: 13.24 ml/min/mmHg
DLCO unc % pred: 52 %
DLCO unc: 13.38 ml/min/mmHg
FEF 25-75 Post: 1.96 L/sec
FEF 25-75 Pre: 1.49 L/sec
FEF2575-%Change-Post: 31 %
FEF2575-%Pred-Post: 84 %
FEF2575-%Pred-Pre: 64 %
FEV1-%Change-Post: 8 %
FEV1-%Pred-Post: 62 %
FEV1-%Pred-Pre: 57 %
FEV1-Post: 1.96 L
FEV1-Pre: 1.82 L
FEV1FVC-%Change-Post: 5 %
FEV1FVC-%Pred-Pre: 103 %
FEV6-%Change-Post: 5 %
FEV6-%Pred-Post: 60 %
FEV6-%Pred-Pre: 57 %
FEV6-Post: 2.46 L
FEV6-Pre: 2.34 L
FEV6FVC-%Change-Post: 0 %
FEV6FVC-%Pred-Post: 106 %
FEV6FVC-%Pred-Pre: 106 %
FVC-%Change-Post: 2 %
FVC-%Pred-Post: 57 %
FVC-%Pred-Pre: 55 %
FVC-Post: 2.46 L
FVC-Pre: 2.4 L
Post FEV1/FVC ratio: 80 %
Post FEV6/FVC ratio: 100 %
Pre FEV1/FVC ratio: 76 %
Pre FEV6/FVC Ratio: 100 %
RV % pred: 77 %
RV: 1.95 L
TLC % pred: 62 %
TLC: 4.36 L

## 2020-06-02 NOTE — Progress Notes (Signed)
Full PFT performed today. °

## 2020-06-03 ENCOUNTER — Other Ambulatory Visit: Payer: Self-pay | Admitting: Family Medicine

## 2020-06-05 MED ORDER — OMEPRAZOLE 20 MG PO CPDR: 20.0000 mg | DELAYED_RELEASE_CAPSULE | Freq: Every day | ORAL | 0 refills | Status: DC

## 2020-06-05 MED ORDER — FENOFIBRATE 145 MG PO TABS: 145.0000 mg | ORAL_TABLET | Freq: Every day | ORAL | 0 refills | Status: DC

## 2020-06-05 MED ORDER — FINASTERIDE 5 MG PO TABS: 5.0000 mg | ORAL_TABLET | Freq: Every day | ORAL | 0 refills | Status: DC

## 2020-06-05 NOTE — Addendum Note (Signed)
Addended by: Hessie Diener on: 06/05/2020 04:49 PM   Modules accepted: Orders

## 2020-06-05 NOTE — Telephone Encounter (Signed)
Pt scheduled 06/14/20 so 30 day supply sent to pharmacy.

## 2020-06-05 NOTE — Telephone Encounter (Signed)
[place]. NTBS 30 days given 05/08/20

## 2020-06-07 ENCOUNTER — Other Ambulatory Visit: Payer: Self-pay

## 2020-06-07 ENCOUNTER — Telehealth: Payer: Self-pay | Admitting: Pharmacy Technician

## 2020-06-07 ENCOUNTER — Encounter: Payer: Self-pay | Admitting: Pulmonary Disease

## 2020-06-07 ENCOUNTER — Ambulatory Visit (INDEPENDENT_AMBULATORY_CARE_PROVIDER_SITE_OTHER): Payer: Medicare Other | Admitting: Pulmonary Disease

## 2020-06-07 ENCOUNTER — Inpatient Hospital Stay: Payer: Medicare Other | Admitting: Pulmonary Disease

## 2020-06-07 VITALS — BP 112/82 | HR 87 | Resp 22

## 2020-06-07 DIAGNOSIS — J84112 Idiopathic pulmonary fibrosis: Secondary | ICD-10-CM

## 2020-06-07 MED ORDER — ALBUTEROL SULFATE HFA 108 (90 BASE) MCG/ACT IN AERS: 2.0000 | INHALATION_SPRAY | Freq: Four times a day (QID) | RESPIRATORY_TRACT | 6 refills | Status: DC | PRN

## 2020-06-07 MED ORDER — PREDNISONE 10 MG PO TABS: ORAL_TABLET | ORAL | 0 refills | Status: AC

## 2020-06-07 NOTE — Telephone Encounter (Signed)
Submitted a Prior Authorization request to [facility] for OFEV 150mg  via Cover My Meds. Will update once we receive a response.   (Key ) [name]

## 2020-06-07 NOTE — Progress Notes (Signed)
Subjective:    Patient ID: Christopher Mata, male    DOB: 1946/08/13, 74 y.o.   MRN: [id number]  HPI 74 year old remote smoker presents for evaluation of abnormal CT showing ILD. He reports dyspnea on exertion worse for the last 2 years but dating back for more than 10 years.  He had hiatal hernia surgery in 2016 which was complicated by a 7-day hospital stay requiring left-sided thoracentesis, CT chest from then showed minimal basal scarring.  Dyspnea is worse with strenuous activity He was given Spiriva and Dulera by his PCP without significant improvement   Admitted 04/2020 with Syncope and persistent hypotension could be related to alpha gal reaction--patient denies eating red meat,  he did have tuna, AKI - resolved  Chief Complaint  Patient presents with  . Consult    Patient has shortness of breath with exertion that started 15 years ago and has got worse. Had a cold 2-3 weeks ago and has chest congestion since then. Has tried mucinex and it helped a little but has not gone away. Productive cough with clear sputum.    He reports a chronic cough productive of minimal white sputum, does not wake him up from sleep he has tried Mucinex with limited benefit. He smoked about a pack per day until he quit in 1988, about 25 pack years, still uses snuff.  He worked as a Hydrologist, was lived in [place]  Impression reviewed CT scan and PFTs. 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 Findings are consistent with UIP   CT angiogram 03/2015 minimal basal scarring  PFTs 05/2020 moderate to severe restriction, ratio 76, FEV1 57%, FVC 55%, TLC 62%, DLCO 52%, no bronchodilator response  Echo 04/2020 EF  60%    Past Medical History:  Diagnosis Date  . Allergy to alpha-gal   . GERD (gastroesophageal reflux disease)   . History of hiatal hernia   . History of nocturia   . Hyperlipidemia   . Hypertension   . Obesity (BMI 30-39.9)   . 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 Seasonal allergies   . Shortness of breath dyspnea    with exertion  . Vertigo     Past Surgical History:  Procedure Laterality Date  . COLONOSCOPY W/ BIOPSIES AND POLYPECTOMY    . ESOPHAGOGASTRODUODENOSCOPY N/A 01/13/2015   Procedure: ESOPHAGOGASTRODUODENOSCOPY (EGD);  Surgeon: Jeani Hawking, MD;  Location: [facility] ENDOSCOPY;  Service: Endoscopy;  Laterality: N/A;  . EYE SURGERY     repaired a detached retina  . HIATAL HERNIA REPAIR  03/17/2015   Procedure: LAPAROSCOPIC REPAIR OF HIATAL HERNIA;  Surgeon: Axel Filler, MD;  Location: [facility] ORS;  Service: General;;  . INSERTION OF MESH  03/17/2015   Procedure: INSERTION OF MESH;  Surgeon: Axel Filler, MD;  Location: [facility] ORS;  Service: General;;     Allergies  Allergen Reactions  . Meat [Alpha-Gal] Rash    Red meat    Social History   Socioeconomic History  . Marital status: Married    Spouse name: Not on file  . Number of children: Not on file  . Years of education: Not on file  . Highest education level: Not on file  Occupational History  . Not on file  Tobacco Use  .  Smoking status: Former Smoker    Packs/day: 1.00    Years: 20.00    Pack years: 20.00    Quit date: 12/06/1986    Years since quitting: 33.5  . Smokeless tobacco: Current User    Types: Snuff  Vaping Use  . Vaping Use: Never used  Substance and Sexual Activity  . Alcohol use: No    Comment: "quit 1.5 years ago.'  . Drug use: No  . Sexual activity: Not Currently  Other Topics Concern  . Not on file  Social History Narrative  . Not on file   Social Determinants of Health   Financial Resource Strain:   . Difficulty of Paying Living  Expenses: Not on file  Food Insecurity:   . Worried About Programme researcher, broadcasting/film/video in the Last Year: Not on file  . Ran Out of Food in the Last Year: Not on file  Transportation Needs:   . Lack of Transportation (Medical): Not on file  . Lack of Transportation (Non-Medical): Not on file  Physical Activity:   . Days of Exercise per Week: Not on file  . Minutes of Exercise per Session: Not on file  Stress:   . Feeling of Stress : Not on file  Social Connections:   . Frequency of Communication with Friends and Family: Not on file  . Frequency of Social Gatherings with Friends and Family: Not on file  . Attends Religious Services: Not on file  . Active Member of Clubs or Organizations: Not on file  . Attends Banker Meetings: Not on file  . Marital Status: Not on file  Intimate Partner Violence:   . Fear of Current or Ex-Partner: Not on file  . Emotionally Abused: Not on file  . Physically Abused: Not on file  . Sexually Abused: Not on file    Family History  Problem Relation Age of Onset  . Heart disease Mother   . Hypertension Mother   . Heart disease Father   . Hypertension Father     Review of Systems  Shortness of breath with activity Loss of appetite, weight changes Nasal congestion Sneezing No rash, joint pains or joint stiffness No orthopnea, PND or pneumatosis    Objective:   Physical Exam  Gen. Pleasant, well-nourished, in no distress, normal affect ENT - no pallor,icterus, no post nasal drip Neck: No JVD, no thyromegaly, no carotid bruits Lungs: no use of accessory muscles, no dullness to percussion, bibasal 1/3 rales no rhonchi  Cardiovascular: Rhythm regular, heart sounds  normal, no murmurs or gallops, no peripheral edema Abdomen: soft and non-tender, no hepatosplenomegaly, BS normal. Musculoskeletal: No deformities, no cyanosis or clubbing Neuro:  alert, non focal       Assessment & Plan:

## 2020-06-07 NOTE — Telephone Encounter (Signed)
Received notification from [facility] regarding a prior authorization for OFEV. Authorization has been APPROVED from 06/07/20 to 10/06/20.   Authorization # [id number]  Ran test claim, patient's copay for 1 month is $2,571.72. Patient filled out [employer] PAP application, will need income documents for submission.  Called patient and advised, he will gather tax documents and will take to [place] office to have sent to the Pharmacy team. Fax# [phone].  2:56 PM Dorthula Nettles, CPhT

## 2020-06-07 NOTE — Patient Instructions (Signed)
You have pulmonary fibrosis,  CT chest is typical, so we do not need biopsy  Blood work to check for inflammation -ANA, CCP, ESR, ACE level  We will start you on antifibrotic medication called O FEV We discussed side effects of diarrhea and liver damage  After starting this medication, you will need blood work to check liver function once a month for 6 months and then every 3 months  Okay to use Mucinex 600 mg twice daily Prescription for albuterol MDI 2 puffs every 6 hours as needed for shortness of breath Short course of prednisone to see if it helps with your breathing Prednisone 10 mg tabs Take 4 tabs  daily with food x 4 days, then 3 tabs daily x 4 days, then 2 tabs daily x 4 days, then 1 tab daily x4 days then stop. #40

## 2020-06-07 NOTE — Telephone Encounter (Signed)
Received New start paperwork for OFEV 150mg. Will update as we work through the benefits process.   

## 2020-06-07 NOTE — Assessment & Plan Note (Addendum)
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, so we do not need biopsy Significant progression compared to 2016  Blood work to check for inflammation -ANA, CCP, ESR, ACE level  We will start you on antifibrotic medication called O FEV We discussed side effects of diarrhea and liver damage  After starting this medication, will need  to check liver function once a month for 6 months and then every 3 months  Okay to use Mucinex 600 mg twice daily Prescription for albuterol MDI 2 puffs every 6 hours as needed for shortness of breath Short course of prednisone to see if it helps with your breathing Prednisone 10 mg tabs Take 4 tabs  daily with food x 4 days, then 3 tabs daily x 4 days, then 2 tabs daily x 4 days, then 1 tab daily x4 days then stop. #40

## 2020-06-08 NOTE — Telephone Encounter (Signed)
Received patient's income documents.   Submitted Patient Assistance Application to [facility] for OFEV along with provider portion and income documents. Will update patient when we receive a response.  Fax# [phone] Phone# [phone]

## 2020-06-14 ENCOUNTER — Ambulatory Visit (INDEPENDENT_AMBULATORY_CARE_PROVIDER_SITE_OTHER): Payer: Medicare Other | Admitting: Family Medicine

## 2020-06-14 ENCOUNTER — Other Ambulatory Visit: Payer: Self-pay

## 2020-06-14 ENCOUNTER — Encounter: Payer: Self-pay | Admitting: Family Medicine

## 2020-06-14 VITALS — BP 103/75 | HR 74 | Temp 98.0°F | Ht 70.0 in | Wt 208.0 lb

## 2020-06-14 DIAGNOSIS — R7303 Prediabetes: Secondary | ICD-10-CM | POA: Diagnosis not present

## 2020-06-14 DIAGNOSIS — I10 Essential (primary) hypertension: Secondary | ICD-10-CM | POA: Diagnosis not present

## 2020-06-14 DIAGNOSIS — I952 Hypotension due to drugs: Secondary | ICD-10-CM | POA: Diagnosis not present

## 2020-06-14 DIAGNOSIS — E785 Hyperlipidemia, unspecified: Secondary | ICD-10-CM | POA: Diagnosis not present

## 2020-06-14 DIAGNOSIS — J8410 Pulmonary fibrosis, unspecified: Secondary | ICD-10-CM

## 2020-06-14 LAB — BAYER DCA HB A1C WAIVED: HB A1C (BAYER DCA - WAIVED): 5.9 % (ref <7.0)

## 2020-06-14 MED ORDER — FENOFIBRATE 145 MG PO TABS
145.0000 mg | ORAL_TABLET | Freq: Every day | ORAL | 3 refills | Status: DC
Start: 2020-06-14 — End: 2021-04-24

## 2020-06-14 MED ORDER — OMEPRAZOLE 20 MG PO CPDR
20.0000 mg | DELAYED_RELEASE_CAPSULE | Freq: Every day | ORAL | 3 refills | Status: DC
Start: 2020-06-14 — End: 2020-10-03

## 2020-06-14 MED ORDER — FINASTERIDE 5 MG PO TABS
5.0000 mg | ORAL_TABLET | Freq: Every day | ORAL | 3 refills | Status: DC
Start: 2020-06-14 — End: 2021-01-18

## 2020-06-14 NOTE — Progress Notes (Signed)
BP 103/75   Pulse 74   Temp 98 F (36.7 C)   Ht _0  (1.778 m)   Wt 208 lb (94.3 kg)   SpO2 93%   BMI 29.84 kg/m    Subjective:   Patient ID: Christopher Mata, male    DOB: [birth year], 74 y.o.   MRN: [id number]  HPI: Christopher Mata is a 74 y.o. male presenting on 06/14/2020 for Medical Management of Chronic Issues and Shortness of Breath   HPI Prediabetes Patient comes in today for recheck of his diabetes. Patient has been currently taking no medication currently, will check A1c today.. Patient is currently on an ACE inhibitor/ARB. Patient has seen an ophthalmologist this year. Patient denies any issues with their feet. The symptom started onset as an adult hypertension and hyperlipidemia ARE RELATED TO DM   Hypertension Patient is currently on no medication currently, and their blood pressure today is 103/75. Patient denies any lightheadedness or dizziness. Patient denies headaches, blurred vision, chest pains, shortness of breath, or weakness. Denies any side effects from medication and is content with current medication.   Hyperlipidemia Patient is coming in for recheck of his hyperlipidemia. The patient is currently taking atorvastatin and fenofibrate. They deny any issues with myalgias or history of liver damage from it. They deny any focal numbness or weakness or chest pain.   Pulmonary fibrosis Patient went and saw Dr. Eartha Inch with pulmonology and said that the testing showed no COPD but showed pulmonary fibrosis and he is currently taking prednisone for that and are thinking about doing an immunomodulator.  He says he is doing better right now that he is on the prednisone and is improving his cough and his breathing.  Relevant past medical, surgical, family and social history reviewed and updated as indicated. Interim medical history since our last visit reviewed. Allergies and medications reviewed and updated.  Review of Systems  Constitutional: Negative for chills and  fever.  HENT: Negative for congestion.   Respiratory: Positive for cough and shortness of breath. Negative for chest tightness and wheezing.   Cardiovascular: Negative for chest pain and leg swelling.  Musculoskeletal: Negative for back pain and gait problem.  Skin: Negative for rash.  All other systems reviewed and are negative.   Per HPI unless specifically indicated above   Allergies as of 06/14/2020      Reactions   Meat [alpha-gal] Rash   Red meat      Medication List       Accurate as of June 14, 2020 12:02 PM. If you have any questions, ask your nurse or doctor.        acetaminophen 325 MG tablet Commonly known as: Tylenol Take 2 tablets (650 mg total) by mouth every 4 (four) hours as needed for mild pain, fever or headache.   albuterol 108 (90 Base) MCG/ACT inhaler Commonly known as: VENTOLIN HFA Inhale 2 puffs into the lungs every 6 (six) hours as needed for wheezing or shortness of breath.   aspirin EC 81 MG tablet Take 1 tablet (81 mg total) by mouth daily with breakfast.   atorvastatin 20 MG tablet Commonly known as: LIPITOR TAKE 1 TABLET BY MOUTH EVERYDAY AT BEDTIME   fenofibrate 145 MG tablet Commonly known as: TRICOR Take 1 tablet (145 mg total) by mouth daily. (Needs to be seen before next refill)   finasteride 5 MG tablet Commonly known as: PROSCAR Take 1 tablet (5 mg total) by mouth daily. (Needs to be seen before  next refill)   omeprazole 20 MG capsule Commonly known as: PRILOSEC Take 1 capsule (20 mg total) by mouth daily. (Needs to be seen before next refill)   predniSONE 10 MG tablet Commonly known as: DELTASONE Take 4 tablets (40 mg total) by mouth daily with breakfast for 4 days, THEN 3 tablets (30 mg total) daily with breakfast for 4 days, THEN 2 tablets (20 mg total) daily with breakfast for 4 days, THEN 1 tablet (10 mg total) daily with breakfast for 4 days. Start taking on: June 07, 2020        Objective:   BP 103/75    Pulse 74   Temp 98 F (36.7 C)   Ht _0  (1.778 m)   Wt 208 lb (94.3 kg)   SpO2 93%   BMI 29.84 kg/m   Wt Readings from Last 3 Encounters:  06/14/20 208 lb (94.3 kg)  04/24/20 215 lb (97.5 kg)  04/17/20 220 lb (99.8 kg)    Physical Exam Vitals and nursing note reviewed.  Constitutional:      General: He is not in acute distress.    Appearance: He is well-developed. He is not diaphoretic.  Eyes:     General: No scleral icterus.    Conjunctiva/sclera: Conjunctivae normal.  Neck:     Thyroid: No thyromegaly.  Cardiovascular:     Rate and Rhythm: Normal rate and regular rhythm.     Heart sounds: Normal heart sounds. No murmur heard.   Pulmonary:     Effort: Pulmonary effort is normal. No respiratory distress.     Breath sounds: Rales (bibasilar crackles) present. No wheezing.  Musculoskeletal:        General: Normal range of motion.     Cervical back: Neck supple.  Lymphadenopathy:     Cervical: No cervical adenopathy.  Skin:    General: Skin is warm and dry.     Findings: No rash.  Neurological:     Mental Status: He is alert and oriented to person, place, and time.     Coordination: Coordination normal.  Psychiatric:        Behavior: Behavior normal.       Assessment & Plan:   Problem List Items Addressed This Visit      Cardiovascular and Mediastinum   Hypertension   Relevant Medications   fenofibrate (TRICOR) 145 MG tablet   Other Relevant Orders   CMP14+EGFR   Hypotension   Relevant Medications   fenofibrate (TRICOR) 145 MG tablet     Other   Prediabetes - Primary   Relevant Orders   Bayer DCA Hb A1c Waived (Completed)   Hyperlipidemia LDL goal <130   Relevant Medications   fenofibrate (TRICOR) 145 MG tablet   Other Relevant Orders   Lipid panel    Other Visit Diagnoses    Pulmonary fibrosis (HCC)       Relevant Orders   ANA Comprehensive Panel   Anti-CCP Ab, IgG + IgA (RDL)   Sedimentation rate   Angiotensin converting enzyme        Put in labs for his pulmonologist because of pulmonary fibrosis.  We will also check her cholesterol levels and chemistry levels.  We will check A1c today as well. Follow up plan: Return in about 6 months (around 12/12/2020), or if symptoms worsen or fail to improve, for Hypertension and prediabetes and pulmonary fibrosis and hyperlipidemia.  Counseling provided for all of the vaccine components Orders Placed This Encounter  Procedures  . [facility]  Hb A1c Waived  . Lipid panel  . CMP14+EGFR  . ANA Comprehensive Panel  . Anti-CCP Ab, IgG + IgA (RDL)  . Sedimentation rate  . Angiotensin converting enzyme    Caryl Pina, MD [facility] 06/14/2020, 12:02 PM

## 2020-06-15 LAB — ANA COMPREHENSIVE PANEL
Anti JO-1: <0.2 AI (ref 0.0–0.9)
Centromere Ab Screen: <0.2 AI (ref 0.0–0.9)
Chromatin Ab SerPl-aCnc: <0.2 AI (ref 0.0–0.9)
ENA RNP Ab: <0.2 AI (ref 0.0–0.9)
ENA SM Ab Ser-aCnc: <0.2 AI (ref 0.0–0.9)
ENA SSA (RO) Ab: 0.2 AI (ref 0.0–0.9)
ENA SSB (LA) Ab: <0.2 AI (ref 0.0–0.9)
Scleroderma (Scl-70) (ENA) Antibody, IgG: <0.2 AI (ref 0.0–0.9)
dsDNA Ab: <1 IU/mL (ref 0–9)

## 2020-06-15 LAB — CMP14+EGFR
ALT: 17 IU/L (ref 0–44)
AST: 15 IU/L (ref 0–40)
Albumin/Globulin Ratio: 1.3 (ref 1.2–2.2)
Albumin: 3.9 g/dL (ref 3.7–4.7)
Alkaline Phosphatase: 52 IU/L (ref 48–121)
BUN/Creatinine Ratio: 10 (ref 10–24)
BUN: 12 mg/dL (ref 8–27)
Bilirubin Total: 0.4 mg/dL (ref 0.0–1.2)
CO2: 26 mmol/L (ref 20–29)
Calcium: 9.5 mg/dL (ref 8.6–10.2)
Chloride: 101 mmol/L (ref 96–106)
Creatinine, Ser: 1.25 mg/dL (ref 0.76–1.27)
GFR calc Af Amer: 66 mL/min/{1.73_m2} (ref >59)
GFR calc non Af Amer: 57 mL/min/{1.73_m2} — ABNORMAL LOW (ref >59)
Globulin, Total: 2.9 g/dL (ref 1.5–4.5)
Glucose: 89 mg/dL (ref 65–99)
Potassium: 3.6 mmol/L (ref 3.5–5.2)
Sodium: 138 mmol/L (ref 134–144)
Total Protein: 6.8 g/dL (ref 6.0–8.5)

## 2020-06-15 LAB — LIPID PANEL
Chol/HDL Ratio: 2 ratio (ref 0.0–5.0)
Cholesterol, Total: 107 mg/dL (ref 100–199)
HDL: 53 mg/dL (ref >39)
LDL Chol Calc (NIH): 42 mg/dL (ref 0–99)
Triglycerides: 52 mg/dL (ref 0–149)
VLDL Cholesterol Cal: 12 mg/dL (ref 5–40)

## 2020-06-15 LAB — ANGIOTENSIN CONVERTING ENZYME: Angio Convert Enzyme: 35 U/L (ref 14–82)

## 2020-06-15 LAB — SEDIMENTATION RATE: Sed Rate: 6 mm/hr (ref 0–30)

## 2020-06-18 LAB — ANTI-CCP AB, IGG + IGA (RDL): Anti-CCP Ab, IgG + IgA (RDL): 20 Units — ABNORMAL HIGH (ref <20)

## 2020-06-20 ENCOUNTER — Telehealth: Payer: Self-pay | Admitting: Family Medicine

## 2020-06-20 NOTE — Telephone Encounter (Signed)
Talked to [facility] in the lab

## 2020-06-21 NOTE — Telephone Encounter (Signed)
Called [employer] to check status of application. Received notification from  [facility] regarding an Approval for OFEV patient assistance from 06/14/20 to 10/06/20.   Phone number:[phone]

## 2020-08-13 ENCOUNTER — Other Ambulatory Visit: Payer: Self-pay | Admitting: Family Medicine

## 2020-08-28 ENCOUNTER — Other Ambulatory Visit: Payer: Self-pay

## 2020-08-28 ENCOUNTER — Encounter: Payer: Self-pay | Admitting: Pulmonary Disease

## 2020-08-28 ENCOUNTER — Ambulatory Visit (INDEPENDENT_AMBULATORY_CARE_PROVIDER_SITE_OTHER): Payer: Medicare Other | Admitting: Pulmonary Disease

## 2020-08-28 VITALS — BP 120/94 | HR 80 | Temp 97.1°F | Ht 71.0 in | Wt 207.0 lb

## 2020-08-28 DIAGNOSIS — J84112 Idiopathic pulmonary fibrosis: Secondary | ICD-10-CM

## 2020-08-28 DIAGNOSIS — Z5181 Encounter for therapeutic drug level monitoring: Secondary | ICD-10-CM

## 2020-08-28 NOTE — Assessment & Plan Note (Signed)
He is tolerating Ofev well.  We again discussed side effects of medications and how to handle diarrhea. We will check LFTs again now and in another 1 month and then go to every 3 months. We discussed progression of IPF, disease appears stable at this time

## 2020-08-28 NOTE — Progress Notes (Signed)
     Subjective:    Patient ID: Christopher Mata, male    DOB: 1945/10/28, 74 y.o.   MRN: [id number]  HPI  75 yo remote smoker for FU of IPF He reports dyspnea on exertion worse for the last 2 years but dating back >   10 years.  He had hiatal hernia surgery in 2016 which was complicated by a 7-day hospital stay requiring left-sided thoracentesis, CT chest from then showed minimal basal scarring.     Admitted 04/2020 with Syncope and persistent hypotension could be related to alpha gal reaction--patient denies eating red meat,he did have tuna, AKI - resolved  Chief Complaint  Patient presents with  . Follow-up    Patient has shortness of breath with exertion, otherwise feels fine. Feels like he has not noticed a big difference with the Ofev   On his last visit, we gave him a diagnosis of IPF and started him on O FEV, serology was negative.  We also gave him a course of prednisone for his persistent cough and dyspnea. Breathing is mostly unchanged, he remains short of breath on walking long distances for example cannot use a backpack to rake leaves He is tolerating Ofev well except for 2-3 loose stools daily which she has gotten used to. 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 Findings are consistent with UIP   CT angiogram 03/2015 minimal basal scarring  PFTs 05/2020 moderate to severe restriction, ratio 76, FEV1 57%, FVC 55%, TLC 62%, DLCO 52%, no bronchodilator response  Echo 04/2020 EF 60%  Review of Systems Patient denies significant dyspnea,cough, hemoptysis,  chest pain, palpitations, pedal edema, orthopnea, paroxysmal nocturnal dyspnea, lightheadedness, nausea, vomiting, abdominal or  leg pains      Objective:   Physical Exam  Gen. Pleasant, well-nourished, in no distress ENT  - no thrush, no pallor/icterus,no post nasal drip Neck: No JVD, no thyromegaly, no carotid bruits Lungs: no use of accessory muscles, no dullness to percussion, bibasal 1/3 rales  Cardiovascular: Rhythm regular, heart sounds  normal, no murmurs or gallops, no peripheral edema Musculoskeletal: No deformities, no cyanosis or clubbing        Assessment & Plan:

## 2020-08-28 NOTE — Patient Instructions (Signed)
LFTs today & before christmas

## 2020-08-29 NOTE — Telephone Encounter (Signed)
Submitted Patient Assistance Application to [employer] for OFEV along with provider portion and income documents. Will update patient when we receive a response.  Fax# [phone] Phone# [phone]  

## 2020-09-04 ENCOUNTER — Other Ambulatory Visit: Payer: Self-pay

## 2020-09-04 ENCOUNTER — Other Ambulatory Visit: Payer: Medicare Other

## 2020-09-04 DIAGNOSIS — J84112 Idiopathic pulmonary fibrosis: Secondary | ICD-10-CM | POA: Diagnosis not present

## 2020-09-04 NOTE — Addendum Note (Signed)
Addended by: Margorie John on: 09/04/2020 09:48 AM   Modules accepted: Orders

## 2020-09-05 LAB — HEPATIC FUNCTION PANEL
ALT: 20 IU/L (ref 0–44)
AST: 26 IU/L (ref 0–40)
Albumin: 4 g/dL (ref 3.7–4.7)
Alkaline Phosphatase: 51 IU/L (ref 44–121)
Bilirubin Total: 0.5 mg/dL (ref 0.0–1.2)
Bilirubin, Direct: 0.18 mg/dL (ref 0.00–0.40)
Total Protein: 7 g/dL (ref 6.0–8.5)

## 2020-09-06 NOTE — Progress Notes (Signed)
Called and went over lab results per Dr Alva with patient. All questions answered and patient expressed full understanding. Nothing further needed at this time.

## 2020-10-03 ENCOUNTER — Other Ambulatory Visit: Payer: Self-pay | Admitting: Family Medicine

## 2020-10-14 IMAGING — CT CT CHEST HIGH RESOLUTION W/O CM
3 of 5 series · 14 of 36 positions shown, 15 images · non-contrast
Comparison: 04/17/2020 chest and right rib radiographs. 03/27/2015
chest CT angiogram.

CLINICAL DATA: Inpatient. Right rib pain, patient woke up on floor.
Right rib fractures on radiographs from earlier today.

EXAM:
CT CHEST WITHOUT CONTRAST
TECHNIQUE: Multidetector CT imaging of the chest was performed following the
standard protocol without intravenous contrast. High resolution
imaging of the lungs, as well as inspiratory and expiratory imaging,
was performed.

[Series 3: standard chest · axial · 0.78mm/px · z∈[+1316,+1572]mm · 8 of 158 slices shown]
[im 15/158  mediastinal]
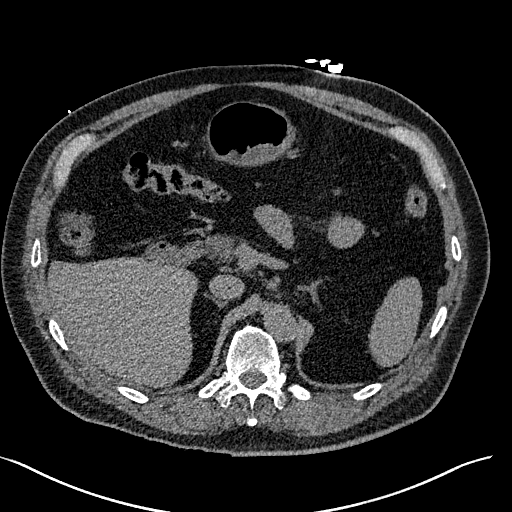
[im 30/158  mediastinal]
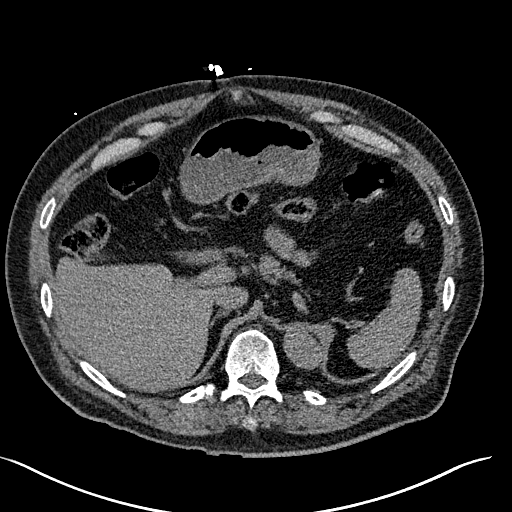
[im 53/158  mediastinal]
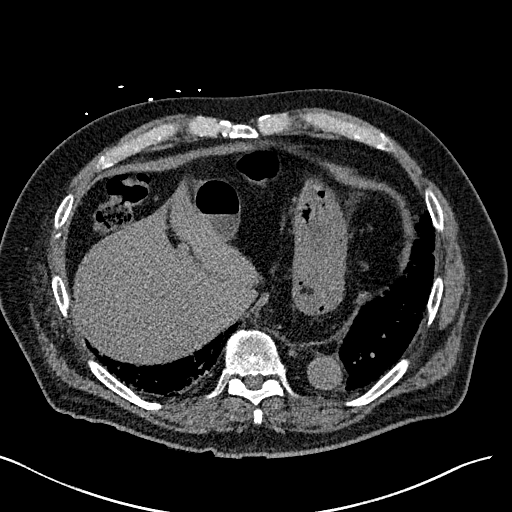
[im 68/158  mediastinal]
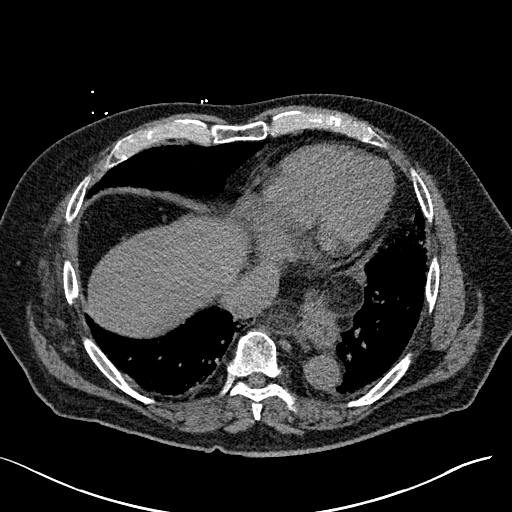
[im 90/158  mediastinal]
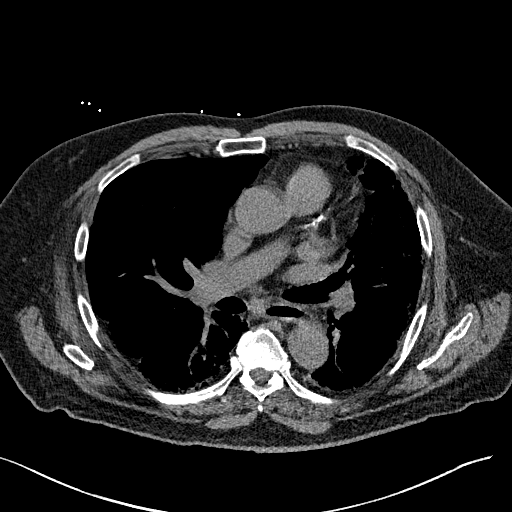
[im 105/158  mediastinal]
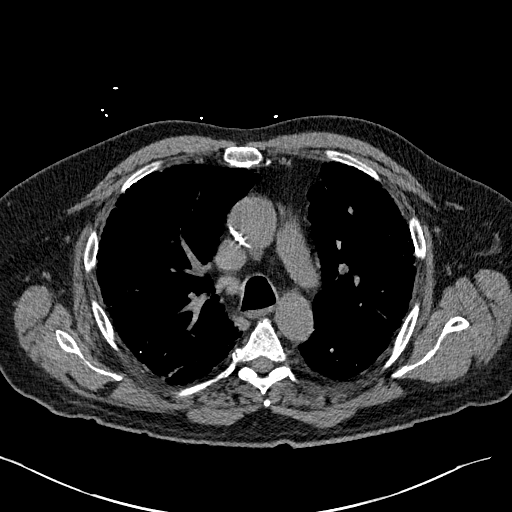
[im 128/158  mediastinal]
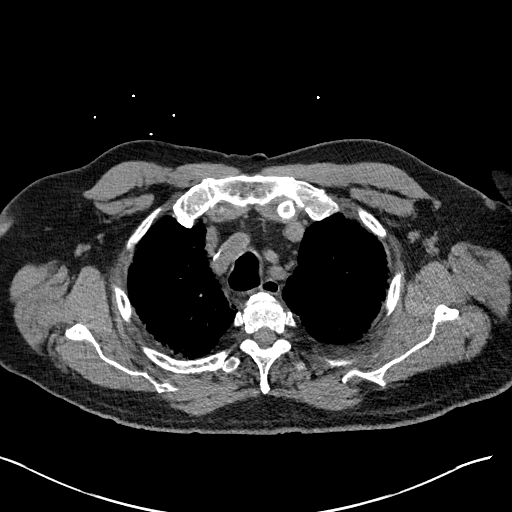
[im 143/158  mediastinal]
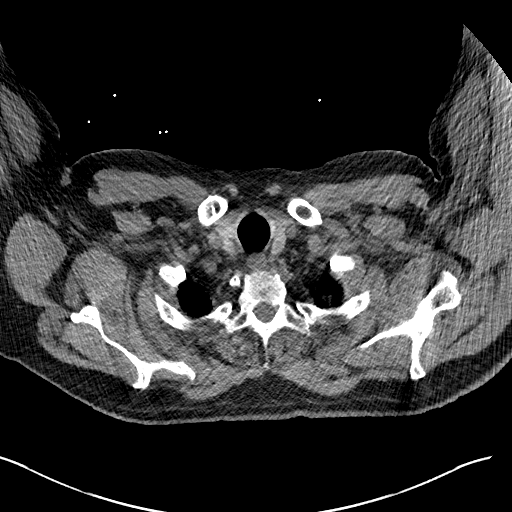

[Series 7: coronal · coronal · 0.76mm/px · 3 of 175 slices shown]
[im 35/175  lung]
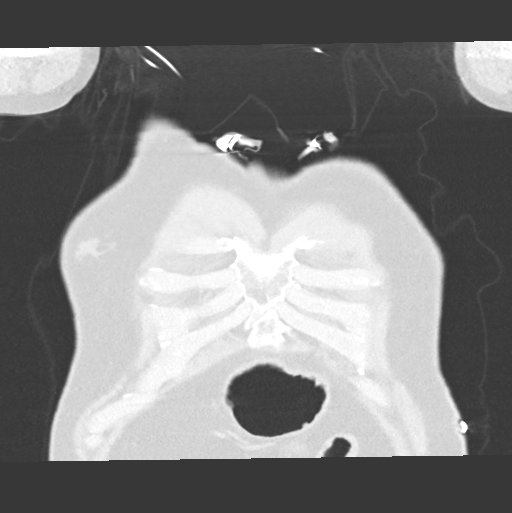
[im 70/175  lung]
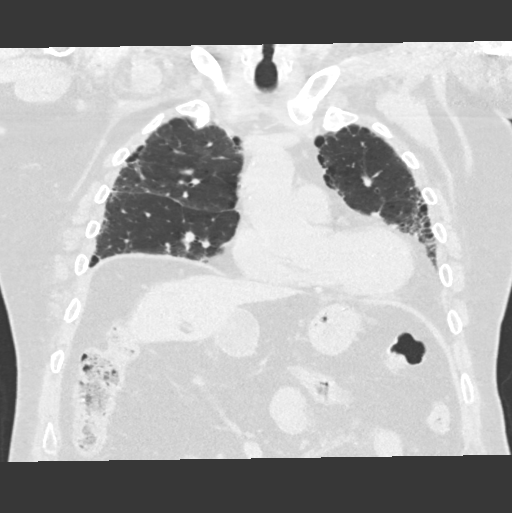
[im 105/175  lung]
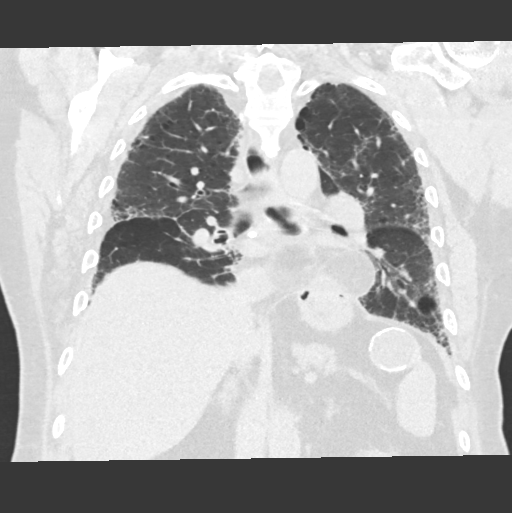

[Series 9: high res insp · axial · 0.79mm/px · z∈[+1332,+1604]mm · 3 of 20 slices shown, 4 images]
[im 1/20  mediastinal]
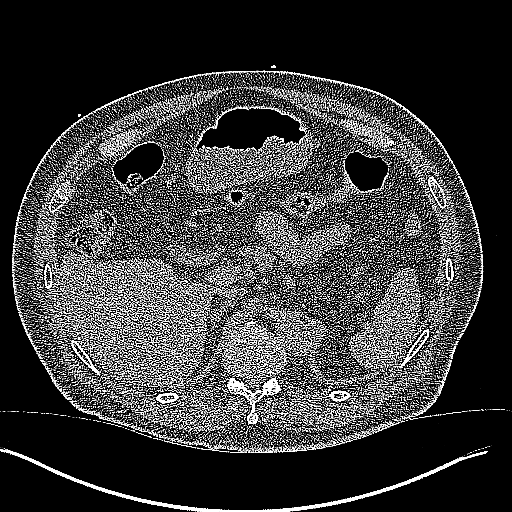
[im 1/20  lung]
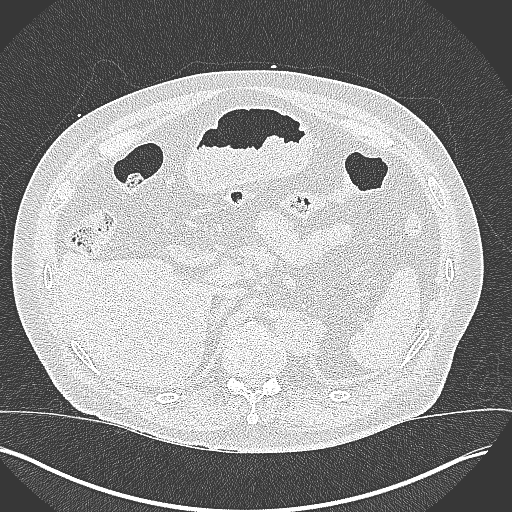
[im 10/20  lung]
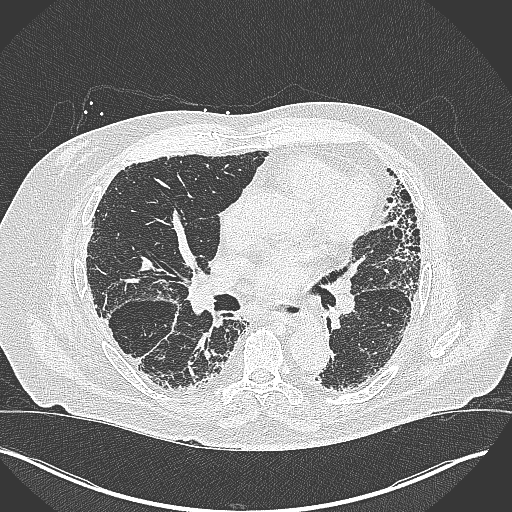
[im 20/20  lung]
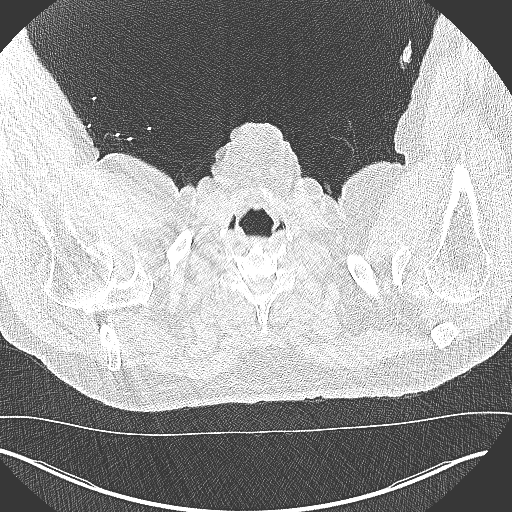

[14 of 36 positions shown; findings below may reference images not displayed]

FINDINGS: Cardiovascular: Normal heart size. No significant pericardial
effusion/thickening. Left anterior descending and right coronary
atherosclerosis. Atherosclerotic nonaneurysmal thoracic aorta.
Normal caliber pulmonary arteries.

Mediastinum/Nodes: No discrete thyroid nodules. Unremarkable
esophagus. No pathologically enlarged axillary, mediastinal or hilar
lymph nodes, noting limited sensitivity for the detection of hilar
adenopathy on this noncontrast study.

Lungs/Pleura: No pneumothorax. No pleural effusion. Mild
centrilobular emphysema with mild diffuse wall thickening. No acute
consolidative airspace disease, lung masses or significant pulmonary
nodules. There is patchy moderate confluent subpleural reticulation
and minimal ground-glass opacity throughout both lungs with
associated mild traction bronchiectasis and architectural
distortion. There is a slight basilar predominance to these
findings. Mild honeycombing is scattered in both lungs, most
prominent in anterior lingula and basilar left lower lobe. These
findings have progressed since 04/04/2015 chest CT.

Upper abdomen: History of hiatal hernia repair with probable small
recurrent hiatal hernia. Stable peripherally calcified 3.6 cm cystic
lesion in the superior spleen, considered benign.

Musculoskeletal: No aggressive appearing focal osseous lesions.
Acute nondisplaced anterolateral right seventh rib fracture. No
additional fractures. Mild thoracic spondylosis. Mild bilateral
gynecomastia, asymmetric to the right, slightly increased.
IMPRESSION: 1. Acute nondisplaced anterolateral right seventh rib fracture. No
pneumothorax.
2. Spectrum of findings compatible with basilar predominant fibrotic
interstitial lung disease with mild honeycombing, progressed since
04/04/2015 chest CT. Findings are consistent with UIP per consensus
guidelines: Diagnosis of Idiopathic Pulmonary Fibrosis: An Official
ATS/ERS/JRS/ALAT Clinical Practice Guideline. Am J Respir Crit Care
Med Vol 198, Lvin 5, [id number][DATE].
3. Two-vessel coronary atherosclerosis.
4. Status post hiatal hernia repair with probable small recurrent
hiatal hernia.
5. Aortic Atherosclerosis (LPHXO-ALP.P) and Emphysema (LPHXO-0T4.A).

## 2020-11-20 ENCOUNTER — Other Ambulatory Visit: Payer: Self-pay

## 2020-11-20 ENCOUNTER — Ambulatory Visit (INDEPENDENT_AMBULATORY_CARE_PROVIDER_SITE_OTHER): Payer: Medicare Other | Admitting: Pulmonary Disease

## 2020-11-20 ENCOUNTER — Encounter: Payer: Self-pay | Admitting: Pulmonary Disease

## 2020-11-20 VITALS — BP 124/80 | HR 74 | Temp 97.1°F | Ht 71.0 in | Wt 208.2 lb

## 2020-11-20 DIAGNOSIS — Z5181 Encounter for therapeutic drug level monitoring: Secondary | ICD-10-CM | POA: Diagnosis not present

## 2020-11-20 DIAGNOSIS — J84112 Idiopathic pulmonary fibrosis: Secondary | ICD-10-CM | POA: Diagnosis not present

## 2020-11-20 NOTE — Patient Instructions (Signed)
LFTs today HRCT next visit may 2022

## 2020-11-20 NOTE — Assessment & Plan Note (Signed)
Tolerating Ofev well overall.  We again discussed that whether this is helping him or not will only be obvious on a long-term review and he should not expect day-to-day changes in his dyspnea by this  Medication. Check LFTs today. We discussed using lozenges or OTC cough syrup for cough Schedule high-resolution CT chest in 3 months

## 2020-11-20 NOTE — Progress Notes (Signed)
   Subjective:    Patient ID: Christopher Mata, male    DOB: Oct 20, 1945, 75 y.o.   MRN: [id number]  HPI  75 yo remote smoker for FU of IPF He reports dyspnea on exertion worse x 2 years but dating back > 10 years. He had hiatal hernia surgery in 2016 which was complicated by a 7-day hospital stay requiring left-sided thoracentesis, CT chest from then showed minimal basal scarring.   Started on ofev 06/2020  Admitted 04/2020 withSyncope and persistent hypotension could be related to alpha gal reaction--patient denies eating red meat,he did have tuna, AKI - resolved  Chief Complaint  Patient presents with  . Follow-up    Productive cough with clear phlegm, shortness of breath with activity   [place] farm He has occasional cough which is relieved by lozenges. He does report reflux and is on omeprazole daily. Tolerating Ofev well otherwise has occasional diarrhea 2 or 3 times a week, Imodium helps. 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 Findings are consistent with UIP  CT angiogram 03/2015 minimal basal scarring  PFTs 05/2020 moderate to severe restriction, ratio 76, FEV1 57%, FVC 55%, TLC 62%, DLCO 52%, no bronchodilator response  Echo 04/2020 EF 60%  Review of Systems neg for any significant sore throat, dysphagia, itching, sneezing, nasal congestion or excess/ purulent secretions, fever, chills, sweats, unintended wt loss, pleuritic or exertional cp, hempoptysis, orthopnea pnd or change in chronic leg swelling. Also denies presyncope, palpitations, heartburn, abdominal pain, nausea, vomiting, diarrhea or change in bowel or urinary habits, dysuria,hematuria, rash, arthralgias, visual complaints, headache, numbness weakness or ataxia.     Objective:    Physical Exam  Gen. Pleasant, well-nourished, in no distress ENT - no thrush, no pallor/icterus,no post nasal drip Neck: No JVD, no thyromegaly, no carotid bruits Lungs: no use of accessory muscles, no dullness to percussion, bibasal rales no rhonchi  Cardiovascular: Rhythm regular, heart sounds  normal, no murmurs or gallops, no peripheral edema Musculoskeletal: No deformities, no cyanosis or clubbing        Assessment & Plan:

## 2020-11-21 ENCOUNTER — Other Ambulatory Visit: Payer: Medicare Other

## 2020-11-21 ENCOUNTER — Other Ambulatory Visit: Payer: Self-pay

## 2020-11-21 DIAGNOSIS — J84112 Idiopathic pulmonary fibrosis: Secondary | ICD-10-CM

## 2020-11-21 DIAGNOSIS — Z5181 Encounter for therapeutic drug level monitoring: Secondary | ICD-10-CM | POA: Diagnosis not present

## 2020-11-21 NOTE — Addendum Note (Signed)
Addended by: Cassell Clement on: 11/21/2020 01:22 PM   Modules accepted: Orders

## 2020-11-22 LAB — HEPATIC FUNCTION PANEL
ALT: 20 IU/L (ref 0–44)
AST: 24 IU/L (ref 0–40)
Albumin: 3.6 g/dL — ABNORMAL LOW (ref 3.7–4.7)
Alkaline Phosphatase: 53 IU/L (ref 44–121)
Bilirubin Total: 0.3 mg/dL (ref 0.0–1.2)
Bilirubin, Direct: 0.14 mg/dL (ref 0.00–0.40)
Total Protein: 6.3 g/dL (ref 6.0–8.5)

## 2020-11-22 NOTE — Progress Notes (Signed)
Called and left message on voicemail to please return phone call to go over results. Contact number provided. 

## 2020-11-24 NOTE — Progress Notes (Signed)
Called and left detailed message (per DPR) on voicemail, LFT/Lab result normal. Advised to please feel free to call back if he had any questions. Contact number provided. Nothing further needed at this time.

## 2020-12-07 DIAGNOSIS — H10013 Acute follicular conjunctivitis, bilateral: Secondary | ICD-10-CM | POA: Diagnosis not present

## 2021-01-15 ENCOUNTER — Other Ambulatory Visit: Payer: Self-pay

## 2021-01-15 ENCOUNTER — Encounter: Payer: Self-pay | Admitting: Family Medicine

## 2021-01-15 ENCOUNTER — Ambulatory Visit (INDEPENDENT_AMBULATORY_CARE_PROVIDER_SITE_OTHER): Payer: Medicare Other | Admitting: Family Medicine

## 2021-01-15 VITALS — BP 116/82 | HR 70 | Ht 71.0 in | Wt 207.0 lb

## 2021-01-15 DIAGNOSIS — T7819XA Other adverse food reactions, not elsewhere classified, initial encounter: Secondary | ICD-10-CM

## 2021-01-15 DIAGNOSIS — E785 Hyperlipidemia, unspecified: Secondary | ICD-10-CM | POA: Diagnosis not present

## 2021-01-15 DIAGNOSIS — I10 Essential (primary) hypertension: Secondary | ICD-10-CM

## 2021-01-15 DIAGNOSIS — N401 Enlarged prostate with lower urinary tract symptoms: Secondary | ICD-10-CM

## 2021-01-15 DIAGNOSIS — R7303 Prediabetes: Secondary | ICD-10-CM

## 2021-01-15 DIAGNOSIS — T781XXD Other adverse food reactions, not elsewhere classified, subsequent encounter: Secondary | ICD-10-CM | POA: Diagnosis not present

## 2021-01-15 DIAGNOSIS — T781XXA Other adverse food reactions, not elsewhere classified, initial encounter: Secondary | ICD-10-CM

## 2021-01-15 DIAGNOSIS — R351 Nocturia: Secondary | ICD-10-CM

## 2021-01-15 MED ORDER — OMEPRAZOLE 20 MG PO CPDR: 20.0000 mg | DELAYED_RELEASE_CAPSULE | Freq: Every day | ORAL | 3 refills | Status: DC

## 2021-01-15 MED ORDER — ATORVASTATIN CALCIUM 20 MG PO TABS: ORAL_TABLET | ORAL | 3 refills | Status: DC

## 2021-01-15 MED ORDER — TAMSULOSIN HCL 0.4 MG PO CAPS: 0.4000 mg | ORAL_CAPSULE | Freq: Every day | ORAL | 3 refills | Status: DC

## 2021-01-15 NOTE — Progress Notes (Signed)
BP 116/82   Pulse 70   Ht _0  (1.803 m)   Wt 207 lb (93.9 kg)   SpO2 94%   BMI 28.87 kg/m    Subjective:   Patient ID: Christopher Mata, male    DOB: 1946/06/12, 75 y.o.   MRN: [id number]  HPI: Christopher Mata is a 75 y.o. male presenting on 01/15/2021 for Medical Management of Chronic Issues   HPI Prediabetes Patient comes in today for recheck of his diabetes. Patient has been currently taking no medication. Patient is not currently on an ACE inhibitor/ARB. Patient has not seen an ophthalmologist this year. Patient denies any issues with their feet. The symptom started onset as an adult hyperlipidemia and hypertension ARE RELATED TO DM   BPH Patient is coming in for recheck on BPH Symptoms: Nocturia Medication: Finasteride Last PSA: 2019, 0.2  Hypertension Patient is currently on metoprolol, and their blood pressure today is 116/82. Patient denies any lightheadedness or dizziness. Patient denies headaches, blurred vision, chest pains, shortness of breath, or weakness. Denies any side effects from medication and is content with current medication.   Hyperlipidemia Patient is coming in for recheck of his hyperlipidemia. The patient is currently taking atorvastatin. They deny any issues with myalgias or history of liver damage from it. They deny any focal numbness or weakness or chest pain.   Relevant past medical, surgical, family and social history reviewed and updated as indicated. Interim medical history since our last visit reviewed. Allergies and medications reviewed and updated.  Review of Systems  Constitutional: Negative for chills and fever.  Eyes: Negative for visual disturbance.  Respiratory: Negative for shortness of breath and wheezing.   Cardiovascular: Negative for chest pain and leg swelling.  Gastrointestinal: Negative for abdominal pain.  Genitourinary: Positive for frequency. Negative for dysuria, hematuria and urgency.  Musculoskeletal: Negative for  back pain and gait problem.  Skin: Negative for rash.  Neurological: Negative for dizziness, weakness, light-headedness and numbness.  All other systems reviewed and are negative.   Per HPI unless specifically indicated above   Allergies as of 01/15/2021      Reactions   Meat [alpha-gal] Rash   Red meat      Medication List       Accurate as of January 15, 2021  4:25 PM. If you have any questions, ask your nurse or doctor.        STOP taking these medications   acetaminophen 325 MG tablet Commonly known as: Tylenol Stopped by: Fransisca Kaufmann Rosielee Corporan, MD     TAKE these medications   albuterol 108 (90 Base) MCG/ACT inhaler Commonly known as: VENTOLIN HFA Inhale 2 puffs into the lungs every 6 (six) hours as needed for wheezing or shortness of breath.   aspirin EC 81 MG tablet Take 1 tablet (81 mg total) by mouth daily with breakfast.   atorvastatin 20 MG tablet Commonly known as: LIPITOR TAKE 1 TABLET BY MOUTH EVERYDAY AT BEDTIME   fenofibrate 145 MG tablet Commonly known as: TRICOR Take 1 tablet (145 mg total) by mouth daily. (Needs to be seen before next refill)   finasteride 5 MG tablet Commonly known as: PROSCAR Take 1 tablet (5 mg total) by mouth daily. (Needs to be seen before next refill)   metoprolol tartrate 25 MG tablet Commonly known as: LOPRESSOR Take 25 mg by mouth 2 (two) times daily.   Ofev 150 MG Caps Generic drug: Nintedanib Take 150 mg by mouth 2 (two) times daily.  omeprazole 20 MG capsule Commonly known as: PRILOSEC TAKE 1 CAPSULE (20 MG TOTAL) BY MOUTH DAILY. (NEEDS TO BE SEEN BEFORE NEXT REFILL)        Objective:   BP 116/82   Pulse 70   Ht _0  (1.803 m)   Wt 207 lb (93.9 kg)   SpO2 94%   BMI 28.87 kg/m   Wt Readings from Last 3 Encounters:  01/15/21 207 lb (93.9 kg)  11/20/20 208 lb 3.2 oz (94.4 kg)  08/28/20 207 lb (93.9 kg)    Physical Exam Vitals and nursing note reviewed.  Constitutional:      General: He is not in  acute distress.    Appearance: He is well-developed. He is not diaphoretic.  Eyes:     General: No scleral icterus.    Conjunctiva/sclera: Conjunctivae normal.  Neck:     Thyroid: No thyromegaly.  Cardiovascular:     Rate and Rhythm: Normal rate and regular rhythm.     Heart sounds: Normal heart sounds. No murmur heard.   Pulmonary:     Effort: Pulmonary effort is normal. No respiratory distress.     Breath sounds: Normal breath sounds. No wheezing.  Musculoskeletal:        General: Normal range of motion.     Cervical back: Neck supple.  Lymphadenopathy:     Cervical: No cervical adenopathy.  Skin:    General: Skin is warm and dry.     Findings: No rash.  Neurological:     Mental Status: He is alert and oriented to person, place, and time.     Coordination: Coordination normal.  Psychiatric:        Behavior: Behavior normal.       Assessment & Plan:   Problem List Items Addressed This Visit      Cardiovascular and Mediastinum   Hypertension   Relevant Medications   atorvastatin (LIPITOR) 20 MG tablet   Other Relevant Orders   CMP14+EGFR     Other   Prediabetes - Primary   Relevant Orders   CBC with Differential/Platelet   CMP14+EGFR   Bayer DCA Hb A1c Waived   Hyperlipidemia LDL goal <130   Relevant Medications   atorvastatin (LIPITOR) 20 MG tablet   Other Relevant Orders   Lipid panel   Benign prostatic hyperplasia with nocturia   Relevant Medications   tamsulosin (FLOMAX) 0.4 MG CAPS capsule   Other Relevant Orders   PSA, total and free   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  He is taking finasteride.  Patient was to be retested for alpha gal coming says he still has allergy but does better with some minor foods  Follow up plan: Return in about 6 months (around 07/17/2021), or if symptoms worsen or  fail to improve, for Prediabetes and hyperlipidemia and hypertension.  Counseling provided for all of the vaccine components No orders of the defined types were placed in this encounter.   Caryl Pina, MD [facility] 01/15/2021, 4:25 PM

## 2021-01-18 ENCOUNTER — Telehealth: Payer: Self-pay

## 2021-01-18 MED ORDER — FINASTERIDE 5 MG PO TABS: 5.0000 mg | ORAL_TABLET | Freq: Every day | ORAL | 0 refills | Status: DC

## 2021-01-18 NOTE — Telephone Encounter (Signed)
  Prescription Request  01/18/2021  What is the name of the medication or equipment? finasteride  Have you contacted your pharmacy to request a refill? (if applicable) yes  Which pharmacy would you like this sent to? [facility]   Patient notified that their request is being sent to the clinical staff for review and that they should receive a response within 2 business days.

## 2021-01-20 ENCOUNTER — Other Ambulatory Visit: Payer: Self-pay | Admitting: Family Medicine

## 2021-02-14 ENCOUNTER — Other Ambulatory Visit: Payer: Self-pay | Admitting: Family Medicine

## 2021-02-22 ENCOUNTER — Other Ambulatory Visit: Payer: Self-pay

## 2021-02-22 ENCOUNTER — Ambulatory Visit (HOSPITAL_COMMUNITY)
Admission: RE | Admit: 2021-02-22 | Discharge: 2021-02-22 | Disposition: A | Discharge status: Home / Self Care | Payer: Medicare Other | Source: Ambulatory Visit | Attending: Pulmonary Disease | Admitting: Pulmonary Disease

## 2021-02-22 DIAGNOSIS — S2241XA Multiple fractures of ribs, right side, initial encounter for closed fracture: Secondary | ICD-10-CM | POA: Diagnosis not present

## 2021-02-22 DIAGNOSIS — J479 Bronchiectasis, uncomplicated: Secondary | ICD-10-CM | POA: Diagnosis not present

## 2021-02-22 DIAGNOSIS — J439 Emphysema, unspecified: Secondary | ICD-10-CM | POA: Diagnosis not present

## 2021-02-22 DIAGNOSIS — J84112 Idiopathic pulmonary fibrosis: Secondary | ICD-10-CM | POA: Diagnosis not present

## 2021-02-27 ENCOUNTER — Ambulatory Visit (INDEPENDENT_AMBULATORY_CARE_PROVIDER_SITE_OTHER): Payer: Medicare Other | Admitting: Pulmonary Disease

## 2021-02-27 ENCOUNTER — Other Ambulatory Visit: Payer: Self-pay

## 2021-02-27 ENCOUNTER — Encounter: Payer: Self-pay | Admitting: Pulmonary Disease

## 2021-02-27 VITALS — BP 112/72 | HR 80 | Temp 97.5°F | Ht 71.0 in | Wt 205.0 lb

## 2021-02-27 DIAGNOSIS — Z5181 Encounter for therapeutic drug level monitoring: Secondary | ICD-10-CM | POA: Diagnosis not present

## 2021-02-27 DIAGNOSIS — J84112 Idiopathic pulmonary fibrosis: Secondary | ICD-10-CM

## 2021-02-27 NOTE — Progress Notes (Signed)
   Subjective:    Patient ID: Christopher Mata, male    DOB: 04/23/1946, 75 y.o.   MRN: [id number]  HPI  68 yoremote smokerfor FU of IPF He reports dyspnea on exertion worse x 2 years but dating back>10 years. Started on ofev 06/2020 PMH - hiatal hernia surgery in 2016 which was complicated by a 7-day hospital stay requiring left-sided thoracentesis, CT chest from then showed minimal basal scarring.   Admitted 04/2020 withSyncope and persistent hypotension ? alpha gal reaction--patient denies eating red meat,he did have tuna, AKI - resolved  Dyspnea is at baseline, while at rest he is okay but does get short of breath on exertion. We discussed CT findings today. 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 Findings are consistent with UIP  CT angiogram 03/2015 minimal basal scarring  PFTs 05/2020 moderate to severe restriction, ratio 76, FEV1 57%, FVC 55%, TLC 62%, DLCO 52%, no bronchodilator response  Review of Systems neg for any significant sore throat, dysphagia, itching, sneezing, nasal congestion or excess/ purulent secretions, fever, chills, sweats, unintended wt loss, pleuritic or exertional cp, hempoptysis, orthopnea pnd or change in chronic leg swelling. Also denies presyncope, palpitations, heartburn, abdominal pain, nausea, vomiting, diarrhea or change in bowel or urinary habits, dysuria,hematuria, rash, arthralgias, visual complaints, headache, numbness weakness or ataxia.     Objective:   Physical Exam  Gen. Pleasant, well-nourished, in no distress ENT - no thrush, no pallor/icterus,no post nasal drip Neck: No JVD,  no thyromegaly, no carotid bruits Lungs: no use of accessory muscles, no dullness to percussion, bibasal dry rales no rhonchi  Cardiovascular: Rhythm regular, heart sounds  normal, no murmurs or gallops, no peripheral edema Musculoskeletal: No deformities, no cyanosis or clubbing         Assessment & Plan:

## 2021-02-27 NOTE — Assessment & Plan Note (Signed)
Stable on CT scan compared to 2021.  Some may be Ofev is helping stabilize his disease We will compare objectively by obtaining spirometry and DLCO in 3 months and compared to last year. Again he does not desaturate on exertion and this is reassuring. We will obtain LFTs for monitoring of drug toxicity

## 2021-02-27 NOTE — Patient Instructions (Signed)
Ambulatory sat Check LFTs  Schedule PFTs in 3 months ( SPiro + DLCO _

## 2021-03-02 ENCOUNTER — Other Ambulatory Visit: Payer: Self-pay

## 2021-03-02 ENCOUNTER — Other Ambulatory Visit: Payer: Medicare Other

## 2021-03-02 DIAGNOSIS — Z5181 Encounter for therapeutic drug level monitoring: Secondary | ICD-10-CM

## 2021-03-02 NOTE — Addendum Note (Signed)
Addended by: Margorie John on: 03/02/2021 02:21 PM   Modules accepted: Orders

## 2021-03-03 LAB — HEPATIC FUNCTION PANEL
ALT: 21 IU/L (ref 0–44)
AST: 22 IU/L (ref 0–40)
Albumin: 3.2 g/dL — ABNORMAL LOW (ref 3.7–4.7)
Alkaline Phosphatase: 47 IU/L (ref 44–121)
Bilirubin Total: 0.3 mg/dL (ref 0.0–1.2)
Bilirubin, Direct: 0.15 mg/dL (ref 0.00–0.40)
Total Protein: 6.2 g/dL (ref 6.0–8.5)

## 2021-03-07 NOTE — Progress Notes (Signed)
Called and went over lab results per Dr Alva with patient. All questions answered and patient expressed full understanding. Nothing further needed at this time.

## 2021-04-16 ENCOUNTER — Other Ambulatory Visit: Payer: Self-pay | Admitting: Family Medicine

## 2021-04-24 ENCOUNTER — Other Ambulatory Visit: Payer: Self-pay | Admitting: Family Medicine

## 2021-04-26 ENCOUNTER — Telehealth: Payer: Self-pay | Admitting: Family Medicine

## 2021-04-26 NOTE — Telephone Encounter (Signed)
Pt [facility] not happy with [facility]. Would like to switch to [facility] in [place]. First he would like an appt to speak with [facility] about coming off of some of his medications. Pt's follow up is in Oct but wishes to come in sooner. Scheduled an appt on 8/24 to discuss meds only. Pt aware to keep routine follow up in October.

## 2021-05-30 ENCOUNTER — Other Ambulatory Visit: Payer: Self-pay

## 2021-05-30 ENCOUNTER — Encounter: Payer: Self-pay | Admitting: Family Medicine

## 2021-05-30 ENCOUNTER — Ambulatory Visit (INDEPENDENT_AMBULATORY_CARE_PROVIDER_SITE_OTHER): Payer: Medicare Other | Admitting: Family Medicine

## 2021-05-30 ENCOUNTER — Telehealth: Payer: Self-pay

## 2021-05-30 VITALS — BP 123/87 | HR 81 | Ht 71.0 in

## 2021-05-30 DIAGNOSIS — E785 Hyperlipidemia, unspecified: Secondary | ICD-10-CM

## 2021-05-30 DIAGNOSIS — R7303 Prediabetes: Secondary | ICD-10-CM

## 2021-05-30 DIAGNOSIS — T781XXA Other adverse food reactions, not elsewhere classified, initial encounter: Secondary | ICD-10-CM | POA: Diagnosis not present

## 2021-05-30 DIAGNOSIS — N401 Enlarged prostate with lower urinary tract symptoms: Secondary | ICD-10-CM | POA: Diagnosis not present

## 2021-05-30 DIAGNOSIS — I10 Essential (primary) hypertension: Secondary | ICD-10-CM | POA: Diagnosis not present

## 2021-05-30 DIAGNOSIS — R351 Nocturia: Secondary | ICD-10-CM

## 2021-05-30 LAB — BAYER DCA HB A1C WAIVED: HB A1C (BAYER DCA - WAIVED): 5.2 % (ref <7.0)

## 2021-05-30 MED ORDER — OMEPRAZOLE 20 MG PO CPDR: 20.0000 mg | DELAYED_RELEASE_CAPSULE | Freq: Every day | ORAL | 3 refills | Status: DC

## 2021-05-30 MED ORDER — FINASTERIDE 5 MG PO TABS: 5.0000 mg | ORAL_TABLET | Freq: Every day | ORAL | 3 refills | Status: DC

## 2021-05-30 MED ORDER — ATORVASTATIN CALCIUM 20 MG PO TABS: 20.0000 mg | ORAL_TABLET | Freq: Every day | ORAL | 3 refills | Status: DC

## 2021-05-30 NOTE — Progress Notes (Signed)
BP 123/87   Pulse 81   Ht _0  (1.803 m)   SpO2 93%   BMI 28.59 kg/m    Subjective:   Patient ID: Christopher Mata, male    DOB: 09-17-1946, 75 y.o.   MRN: [id number]  HPI: Christopher Mata is a 75 y.o. male presenting on 05/30/2021 for Medical Management of Chronic Issues (Would like to discuss medications. Would like for all Rx's to be sent to [facility])   HPI Prediabetes Patient comes in today for recheck of his diabetes. Patient has been currently taking no medication has been diet controlled, will check his blood. Patient is not currently on an ACE inhibitor/ARB. Patient has not seen an ophthalmologist this year. Patient denies any issues with their feet. The symptom started onset as an adult hypertension and hyperlipidemia ARE RELATED TO DM   Hypertension Patient is currently on metoprolol, and their blood pressure today is 123/87. Patient denies any lightheadedness or dizziness. Patient denies headaches, blurred vision, chest pains, shortness of breath, or weakness. Denies any side effects from medication and is content with current medication.   Hyperlipidemia Patient is coming in for recheck of his hyperlipidemia. The patient is currently taking atorvastatin and fenofibrate, will stop the fenofibrate. They deny any issues with myalgias or history of liver damage from it. They deny any focal numbness or weakness or chest pain.   Relevant past medical, surgical, family and social history reviewed and updated as indicated. Interim medical history since our last visit reviewed. Allergies and medications reviewed and updated.  Review of Systems  Constitutional:  Negative for chills and fever.  Eyes:  Negative for visual disturbance.  Respiratory:  Negative for shortness of breath and wheezing.   Cardiovascular:  Negative for chest pain and leg swelling.  Musculoskeletal:  Negative for back pain and gait problem.  Skin:  Negative for rash.  Neurological:  Negative for  dizziness and light-headedness.  All other systems reviewed and are negative.  Per HPI unless specifically indicated above   Allergies as of 05/30/2021       Reactions   Meat [alpha-gal] Rash   Red meat        Medication List        Accurate as of May 30, 2021  2:20 PM. If you have any questions, ask your nurse or doctor.          STOP taking these medications    aspirin EC 81 MG tablet Stopped by: Worthy Rancher, MD   fenofibrate 145 MG tablet Commonly known as: TRICOR Stopped by: Fransisca Kaufmann Dejai Schubach, MD   metoprolol tartrate 25 MG tablet Commonly known as: LOPRESSOR Stopped by: Fransisca Kaufmann Chava Dulac, MD   tamsulosin 0.4 MG Caps capsule Commonly known as: FLOMAX Stopped by: Fransisca Kaufmann Malaky Tetrault, MD       TAKE these medications    albuterol 108 (90 Base) MCG/ACT inhaler Commonly known as: VENTOLIN HFA Inhale 2 puffs into the lungs every 6 (six) hours as needed for wheezing or shortness of breath.   atorvastatin 20 MG tablet Commonly known as: LIPITOR Take 1 tablet (20 mg total) by mouth at bedtime. What changed:  how much to take how to take this when to take this additional instructions Changed by: Fransisca Kaufmann Wynn Alldredge, MD   finasteride 5 MG tablet Commonly known as: PROSCAR Take 1 tablet (5 mg total) by mouth daily. What changed: See the new instructions. Changed by: Worthy Rancher, MD   gentamicin 0.3 %  ophthalmic solution Commonly known as: GARAMYCIN 1 drop 4 (four) times daily.   Ofev 150 MG Caps Generic drug: Nintedanib Take 150 mg by mouth 2 (two) times daily.   omeprazole 20 MG capsule Commonly known as: PRILOSEC Take 1 capsule (20 mg total) by mouth daily. What changed: See the new instructions. Changed by: Fransisca Kaufmann Keyontae Huckeby, MD         Objective:   BP 123/87   Pulse 81   Ht _0  (1.803 m)   SpO2 93%   BMI 28.59 kg/m   Wt Readings from Last 3 Encounters:  02/27/21 205 lb (93 kg)  01/15/21 207 lb (93.9 kg)   11/20/20 208 lb 3.2 oz (94.4 kg)    Physical Exam Vitals and nursing note reviewed.  Constitutional:      General: He is not in acute distress.    Appearance: He is well-developed. He is not diaphoretic.  Eyes:     General: No scleral icterus.    Conjunctiva/sclera: Conjunctivae normal.  Neck:     Thyroid: No thyromegaly.  Cardiovascular:     Rate and Rhythm: Normal rate and regular rhythm.     Heart sounds: Normal heart sounds. No murmur heard. Pulmonary:     Effort: Pulmonary effort is normal. No respiratory distress.     Breath sounds: Normal breath sounds. No wheezing.  Musculoskeletal:        General: Normal range of motion.     Cervical back: Neck supple.  Lymphadenopathy:     Cervical: No cervical adenopathy.  Skin:    General: Skin is warm and dry.     Findings: No rash.  Neurological:     Mental Status: He is alert and oriented to person, place, and time.     Coordination: Coordination normal.  Psychiatric:        Behavior: Behavior normal.      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  Removal of metoprolol because blood pressure is doing better.  We will take off the fenofibrate and recheck with the cholesterol today but he was doing good last time and stop the baby aspirin.  Continue other  medicines.  Follow up plan: Return in about 6 months (around 11/30/2021), or if symptoms worsen or fail to improve, for Prediabetes hypertension and cholesterol.  Counseling provided for all of the vaccine components Orders Placed This Encounter  Procedures   Bayer DCA Hb A1c Waived   CBC with Differential/Platelet   CMP14+EGFR   Lipid panel   Alpha-Gal Panel   PSA, total and free    Caryl Pina, MD [facility] 05/30/2021, 2:20 PM

## 2021-05-30 NOTE — Telephone Encounter (Signed)
[facility] no longer [facility] [place]. Pharmacy made aware today. Spoke with pharmacist.

## 2021-06-04 LAB — LIPID PANEL
Chol/HDL Ratio: 2.9 ratio (ref 0.0–5.0)
Cholesterol, Total: 100 mg/dL (ref 100–199)
HDL: 34 mg/dL — ABNORMAL LOW (ref >39)
LDL Chol Calc (NIH): 52 mg/dL (ref 0–99)
Triglycerides: 65 mg/dL (ref 0–149)
VLDL Cholesterol Cal: 14 mg/dL (ref 5–40)

## 2021-06-04 LAB — CBC WITH DIFFERENTIAL/PLATELET
Basophils Absolute: 0 10*3/uL (ref 0.0–0.2)
Basos: 0 %
EOS (ABSOLUTE): 0.3 10*3/uL (ref 0.0–0.4)
Eos: 2 %
Hematocrit: 47 % (ref 37.5–51.0)
Hemoglobin: 16.3 g/dL (ref 13.0–17.7)
Immature Grans (Abs): 0 10*3/uL (ref 0.0–0.1)
Immature Granulocytes: 0 %
Lymphocytes Absolute: 2.9 10*3/uL (ref 0.7–3.1)
Lymphs: 25 %
MCH: 31 pg (ref 26.6–33.0)
MCHC: 34.7 g/dL (ref 31.5–35.7)
MCV: 90 fL (ref 79–97)
Monocytes Absolute: 0.8 10*3/uL (ref 0.1–0.9)
Monocytes: 7 %
Neutrophils Absolute: 7.7 10*3/uL — ABNORMAL HIGH (ref 1.4–7.0)
Neutrophils: 66 %
Platelets: 308 10*3/uL (ref 150–450)
RBC: 5.25 x10E6/uL (ref 4.14–5.80)
RDW: 11.9 % (ref 11.6–15.4)
WBC: 11.7 10*3/uL — ABNORMAL HIGH (ref 3.4–10.8)

## 2021-06-04 LAB — CMP14+EGFR
ALT: 20 IU/L (ref 0–44)
AST: 33 IU/L (ref 0–40)
Albumin/Globulin Ratio: 1.2 (ref 1.2–2.2)
Albumin: 3.5 g/dL — ABNORMAL LOW (ref 3.7–4.7)
Alkaline Phosphatase: 45 IU/L (ref 44–121)
BUN/Creatinine Ratio: 10 (ref 10–24)
BUN: 12 mg/dL (ref 8–27)
Bilirubin Total: 0.3 mg/dL (ref 0.0–1.2)
CO2: 20 mmol/L (ref 20–29)
Calcium: 8.6 mg/dL (ref 8.6–10.2)
Chloride: 103 mmol/L (ref 96–106)
Creatinine, Ser: 1.2 mg/dL (ref 0.76–1.27)
Globulin, Total: 2.9 g/dL (ref 1.5–4.5)
Glucose: 127 mg/dL — ABNORMAL HIGH (ref 65–99)
Potassium: 4.5 mmol/L (ref 3.5–5.2)
Sodium: 137 mmol/L (ref 134–144)
Total Protein: 6.4 g/dL (ref 6.0–8.5)
eGFR: 63 mL/min/{1.73_m2} (ref >59)

## 2021-06-04 LAB — ALPHA-GAL PANEL
Allergen Lamb IgE: <0.1 kU/L
Beef IgE: 0.21 kU/L — AB
IgE (Immunoglobulin E), Serum: 525 IU/mL — ABNORMAL HIGH (ref 6–495)
O215-IgE Alpha-Gal: 2.5 kU/L — AB
Pork IgE: <0.1 kU/L

## 2021-06-04 LAB — PSA, TOTAL AND FREE
PSA, Free Pct: 16.7 %
PSA, Free: 0.05 ng/mL
Prostate Specific Ag, Serum: 0.3 ng/mL (ref 0.0–4.0)

## 2021-07-15 ENCOUNTER — Other Ambulatory Visit: Payer: Self-pay | Admitting: Family Medicine

## 2021-07-16 ENCOUNTER — Ambulatory Visit: Payer: Medicare Other | Admitting: Family Medicine

## 2021-07-23 ENCOUNTER — Encounter: Payer: Self-pay | Admitting: Pulmonary Disease

## 2021-07-23 ENCOUNTER — Other Ambulatory Visit: Payer: Self-pay

## 2021-07-23 ENCOUNTER — Ambulatory Visit (INDEPENDENT_AMBULATORY_CARE_PROVIDER_SITE_OTHER): Payer: Medicare Other | Admitting: Pulmonary Disease

## 2021-07-23 VITALS — BP 134/88 | HR 74 | Temp 98.7°F | Ht 71.0 in | Wt 199.1 lb

## 2021-07-23 DIAGNOSIS — J84112 Idiopathic pulmonary fibrosis: Secondary | ICD-10-CM

## 2021-07-23 DIAGNOSIS — Z23 Encounter for immunization: Secondary | ICD-10-CM | POA: Diagnosis not present

## 2021-07-23 MED ORDER — BENZONATATE 200 MG PO CAPS: 200.0000 mg | ORAL_CAPSULE | ORAL | 1 refills | Status: DC | PRN

## 2021-07-23 NOTE — Patient Instructions (Addendum)
Schedule pFTs  Continue on Ofev LFTs in end Nov  Benzonatate 200 mg as needed for cough # 60

## 2021-07-23 NOTE — Assessment & Plan Note (Addendum)
Appears progressive by history but will need PFTs to objectively clarify if lung function has worsened. We discussed alternative medication Including Esbriet and nausea and photosensitivity.  He prefers to stay on Ofev at the current time  Will prescribe Tessalon for cough and persistent, can trial low-dose prednisone for short course

## 2021-07-23 NOTE — Progress Notes (Signed)
   Subjective:    Patient ID: Christopher Mata, male    DOB: July 28, 1946, 75 y.o.   MRN: [id number]  HPI 75 yo remote smoker for FU of IPF He reports dyspnea on exertion worse x 2 years but dating back > 10 years.  Started on ofev 06/2020 PMH - hiatal hernia surgery in 2016 which was complicated by a 7-day hospital stay requiring left-sided thoracentesis, CT chest from then showed minimal basal scarring.     Admitted 04/2020 with Syncope and persistent hypotension ? alpha gal reaction--patient denies eating red meat,  he did have tuna, AKI - resolved  Chief Complaint  Patient presents with   Follow-up    SOB is about he same as last visit. Patient says he feels that his breathing may be getting a little worse. Especially over the summer.    Follow-up with. Complains of increased dyspnea and persistent cough Ofev is causing him intermittent diarrhea this has decreased his quality of continues to stay indoors. He feels a cough secondary to sinus drip and sinus esophagram taking Zyrtec causes constipation On his last office visit we had requested PFTs.  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 Findings are consistent with UIP    CT angiogram 03/2015 minimal basal scarring   PFTs 05/2020 moderate to severe restriction, ratio 76, FEV1 57%, FVC 55%, TLC 62%, DLCO 52%, no bronchodilator response   Review of Systems neg for any significant sore throat, dysphagia, itching, sneezing, nasal congestion or excess/ purulent secretions, fever, chills, sweats, unintended wt loss, pleuritic or exertional cp, hempoptysis, orthopnea pnd or change in chronic leg swelling. Also denies presyncope, palpitations,  heartburn, abdominal pain, nausea, vomiting, diarrhea or change in bowel or urinary habits, dysuria,hematuria, rash, arthralgias, visual complaints, headache, numbness weakness or ataxia.     Objective:   Physical Exam  Gen. Pleasant, well-nourished, in no distress ENT - no thrush, no pallor/icterus,no post nasal drip Neck: No JVD, no thyromegaly, no carotid bruits Lungs: no use of accessory muscles, no dullness to percussion, bibasal dry crackles Cardiovascular: Rhythm regular, heart sounds  normal, no murmurs or gallops, no peripheral edema Musculoskeletal: No deformities, no cyanosis or clubbing        Assessment & Plan:   Therapeutic monitoring -check LFTs every 3 months, next due November since he is also on statin that can cause liver toxicity

## 2021-07-25 ENCOUNTER — Telehealth: Payer: Self-pay | Admitting: *Deleted

## 2021-07-25 NOTE — Telephone Encounter (Signed)
Patient's alpha gal still showed mildly positive for pork beef was normal

## 2021-07-25 NOTE — Telephone Encounter (Signed)
TC from [facility] pharmacy to get refill on pt's Fenofibrate, informed them that this was DCd at 05/30/21 visit. TC to pt to also let him know that this was stopped at that appt.  While we were on the phone, pt wanted to know about his AlphaGal results from that visit Please review and advise

## 2021-07-26 NOTE — Telephone Encounter (Signed)
Pt informed and understood

## 2021-10-19 ENCOUNTER — Other Ambulatory Visit (HOSPITAL_COMMUNITY)
Admission: RE | Admit: 2021-10-19 | Discharge: 2021-10-19 | Disposition: A | Discharge status: Home / Self Care | Payer: Medicare Other | Source: Ambulatory Visit | Attending: Pulmonary Disease | Admitting: Pulmonary Disease

## 2021-10-19 DIAGNOSIS — Z20822 Contact with and (suspected) exposure to covid-19: Secondary | ICD-10-CM | POA: Insufficient documentation

## 2021-10-19 DIAGNOSIS — Z01812 Encounter for preprocedural laboratory examination: Secondary | ICD-10-CM | POA: Insufficient documentation

## 2021-10-19 DIAGNOSIS — Z01818 Encounter for other preprocedural examination: Secondary | ICD-10-CM

## 2021-10-20 LAB — SARS CORONAVIRUS 2 (TAT 6-24 HRS): SARS Coronavirus 2: NEGATIVE

## 2021-10-23 ENCOUNTER — Ambulatory Visit (HOSPITAL_COMMUNITY)
Admission: RE | Admit: 2021-10-23 | Discharge: 2021-10-23 | Disposition: A | Discharge status: Home / Self Care | Payer: Medicare Other | Source: Ambulatory Visit | Attending: Pulmonary Disease | Admitting: Pulmonary Disease

## 2021-10-23 ENCOUNTER — Other Ambulatory Visit: Payer: Self-pay

## 2021-10-23 DIAGNOSIS — J84112 Idiopathic pulmonary fibrosis: Secondary | ICD-10-CM | POA: Diagnosis not present

## 2021-10-23 LAB — PULMONARY FUNCTION TEST
DL/VA % pred: 61 %
DL/VA: 2.44 ml/min/mmHg/L
DLCO unc % pred: 37 %
DLCO unc: 9.42 ml/min/mmHg
FEF 25-75 Post: 2.48 L/sec
FEF 25-75 Pre: 1.83 L/sec
FEF2575-%Change-Post: 35 %
FEF2575-%Pred-Post: 111 %
FEF2575-%Pred-Pre: 82 %
FEV1-%Change-Post: 7 %
FEV1-%Pred-Post: 72 %
FEV1-%Pred-Pre: 67 %
FEV1-Post: 2.22 L
FEV1-Pre: 2.07 L
FEV1FVC-%Change-Post: 0 %
FEV1FVC-%Pred-Pre: 107 %
FEV6-%Change-Post: 8 %
FEV6-%Pred-Post: 72 %
FEV6-%Pred-Pre: 66 %
FEV6-Post: 2.86 L
FEV6-Pre: 2.63 L
FEV6FVC-%Pred-Post: 106 %
FEV6FVC-%Pred-Pre: 106 %
FVC-%Change-Post: 8 %
FVC-%Pred-Post: 67 %
FVC-%Pred-Pre: 62 %
FVC-Post: 2.86 L
FVC-Pre: 2.65 L
Post FEV1/FVC ratio: 78 %
Post FEV6/FVC ratio: 100 %
Pre FEV1/FVC ratio: 78 %
Pre FEV6/FVC Ratio: 100 %
RV % pred: 74 %
RV: 1.91 L
TLC % pred: 64 %
TLC: 4.57 L

## 2021-10-23 MED ORDER — ALBUTEROL SULFATE (2.5 MG/3ML) 0.083% IN NEBU
2.5000 mg | INHALATION_SOLUTION | Freq: Once | RESPIRATORY_TRACT | Status: AC
  Administered 2021-10-23: 2.5 mg via RESPIRATORY_TRACT

## 2021-10-25 ENCOUNTER — Other Ambulatory Visit: Payer: Self-pay

## 2021-10-25 DIAGNOSIS — J84112 Idiopathic pulmonary fibrosis: Secondary | ICD-10-CM

## 2021-11-14 ENCOUNTER — Telehealth: Payer: Self-pay | Admitting: Pulmonary Disease

## 2021-11-16 ENCOUNTER — Other Ambulatory Visit (HOSPITAL_COMMUNITY): Payer: Self-pay

## 2021-11-16 NOTE — Telephone Encounter (Signed)
Called patient to determine if he has completed Ofev renewal application. He expressed frustration on response from clinic. I apologized for delay and advised that we are trying to work through [employer] applications at this time. He asked why he has to apply every year when income has not changed. I advised that it is a program requirement and unfortunately I cannot change their protocol.  Also advised that he will need to submit income documents each time. He states he only has a few days worth of medication.  He requested application be mailed to his home. He will fax to [facility] Cares directly and notify our clinic once completed. Provider portion will be placed in Dr. Reginia Naas mailbox today to be completed and faxed to [employer]  Chesley Mires, PharmD, MPH, BCPS Clinical Pharmacist (Rheumatology and Pulmonology)

## 2021-11-19 NOTE — Telephone Encounter (Signed)
Patient called asking where Ofev application is. I advised that we just mailed application on Friday and may not have reached him. Advised it may not reach his home until later this week. He verbalized understanding  Chesley Mires, PharmD, MPH, BCPS Clinical Pharmacist (Rheumatology and Pulmonology)

## 2021-11-20 NOTE — Telephone Encounter (Signed)
Provider portion of [facility] application received.  Submitted a Prior Authorization request to [facility] for OFEV via CoverMyMeds. Will update once we receive a response.  Key: [id number] Per automated response - prior authorization is APPROVED from 10/07/21 through 10/06/22. Case ID: [id number]  Placed provider form, print out of CMM approval, med list, and insurance card copy in PAP pending info folder. We are awaiting notification from patient regarding submission of his application to [facility] cares prior to sending provider portion  Chesley Mires, PharmD, MPH, BCPS Clinical Pharmacist (Rheumatology and Pulmonology)

## 2021-11-28 ENCOUNTER — Other Ambulatory Visit (HOSPITAL_COMMUNITY): Payer: Self-pay

## 2021-11-28 NOTE — Telephone Encounter (Signed)
Received patient portion of [employer] renewal application. Income that patient listed exceeds income threshold. Spoke with patient who states he sold some property. States that he cannot afford copay of [phone] per month.  He states that he also experiences diarrhea intermittently with Ofev. I advised that there is a lower dose of Ofev that he can discuss with Dr. Vassie Loll at f/u visit  Patient advised that h  Submitted Patient Assistance Application to [facility] for [facility] along with provider portion, PA and income documents. Will update patient when we receive a response.  Fax# [phone] Phone# [phone]  Chesley Mires, PharmD, MPH, BCPS Clinical Pharmacist (Rheumatology and Pulmonology)

## 2021-11-29 ENCOUNTER — Ambulatory Visit (INDEPENDENT_AMBULATORY_CARE_PROVIDER_SITE_OTHER): Payer: Medicare Other | Admitting: Family Medicine

## 2021-11-29 ENCOUNTER — Encounter: Payer: Self-pay | Admitting: Family Medicine

## 2021-11-29 VITALS — BP 121/91 | HR 92 | Ht 71.0 in | Wt 188.0 lb

## 2021-11-29 DIAGNOSIS — R7303 Prediabetes: Secondary | ICD-10-CM

## 2021-11-29 DIAGNOSIS — E785 Hyperlipidemia, unspecified: Secondary | ICD-10-CM

## 2021-11-29 DIAGNOSIS — I10 Essential (primary) hypertension: Secondary | ICD-10-CM

## 2021-11-29 LAB — BAYER DCA HB A1C WAIVED: HB A1C (BAYER DCA - WAIVED): 5.7 % — ABNORMAL HIGH (ref 4.8–5.6)

## 2021-11-29 NOTE — Progress Notes (Signed)
BP (!) 121/91    Pulse 92    Ht _0  (1.803 m)    Wt 188 lb (85.3 kg)    SpO2 92%    BMI 26.22 kg/m    Subjective:   Patient ID: Christopher Mata, male    DOB: 02-28-1946, 76 y.o.   MRN: [id number]  HPI: Christopher Mata is a 76 y.o. male presenting on 11/29/2021 for Medical Management of Chronic Issues, Gastroesophageal Reflux, and Prediabetes   HPI Prediabetes Patient comes in today for recheck of his diabetes. Patient has been currently taking no medication currently. Patient is not currently on an ACE inhibitor/ARB. Patient has not seen an ophthalmologist this year. Patient denies any issues with their feet. The symptom started onset as an adult hypertension and hyperlipidemia ARE RELATED TO DM   Hypertension Patient is currently on no medication currently, and their blood pressure today is 121/91. Patient denies any lightheadedness or dizziness. Patient denies headaches, blurred vision, chest pains, shortness of breath, or weakness. Denies any side effects from medication and is content with current medication.   Hyperlipidemia Patient is coming in for recheck of his hyperlipidemia. The patient is currently taking atorvastatin patient is. They deny any issues with myalgias or history of liver damage from it. They deny any focal numbness or weakness or chest pain.   GERD Patient is currently on omeprazole.  She denies any major symptoms or abdominal pain or belching or burping. She denies any blood in her stool or lightheadedness or dizziness.   Relevant past medical, surgical, family and social history reviewed and updated as indicated. Interim medical history since our last visit reviewed. Allergies and medications reviewed and updated.  Review of Systems  Constitutional:  Negative for chills and fever.  Eyes:  Negative for visual disturbance.  Respiratory:  Negative for shortness of breath and wheezing.   Cardiovascular:  Negative for chest pain and leg swelling.   Musculoskeletal:  Negative for back pain and gait problem.  Skin:  Negative for rash.  Neurological:  Negative for dizziness, weakness and light-headedness.  All other systems reviewed and are negative.  Per HPI unless specifically indicated above   Allergies as of 11/29/2021       Reactions   Meat [alpha-gal] Rash   Red meat        Medication List        Accurate as of November 29, 2021  4:04 PM. If you have any questions, ask your nurse or doctor.          STOP taking these medications    albuterol 108 (90 Base) MCG/ACT inhaler Commonly known as: VENTOLIN HFA Stopped by: Fransisca Kaufmann Qasim Diveley, MD   benzonatate 200 MG capsule Commonly known as: TESSALON Stopped by: Fransisca Kaufmann Latavious Bitter, MD   gentamicin 0.3 % ophthalmic solution Commonly known as: GARAMYCIN Stopped by: Worthy Rancher, MD   Ofev 150 MG Caps Generic drug: Nintedanib Stopped by: Fransisca Kaufmann Crystle Carelli, MD       TAKE these medications    atorvastatin 20 MG tablet Commonly known as: LIPITOR Take 1 tablet (20 mg total) by mouth at bedtime.   finasteride 5 MG tablet Commonly known as: PROSCAR Take 1 tablet (5 mg total) by mouth daily.   omeprazole 20 MG capsule Commonly known as: PRILOSEC Take 1 capsule (20 mg total) by mouth daily.         Objective:   BP (!) 121/91    Pulse 92  Ht _0  (1.803 m)    Wt 188 lb (85.3 kg)    SpO2 92%    BMI 26.22 kg/m   Wt Readings from Last 3 Encounters:  11/29/21 188 lb (85.3 kg)  07/23/21 199 lb 1.9 oz (90.3 kg)  02/27/21 205 lb (93 kg)    Physical Exam Vitals and nursing note reviewed.  Constitutional:      General: He is not in acute distress.    Appearance: He is well-developed. He is not diaphoretic.  Eyes:     General: No scleral icterus.    Conjunctiva/sclera: Conjunctivae normal.  Neck:     Thyroid: No thyromegaly.  Cardiovascular:     Rate and Rhythm: Normal rate and regular rhythm.     Heart sounds: Normal heart sounds. No  murmur heard. Pulmonary:     Effort: Pulmonary effort is normal. No respiratory distress.     Breath sounds: Normal breath sounds. No wheezing.  Musculoskeletal:        General: Normal range of motion.     Cervical back: Neck supple.  Lymphadenopathy:     Cervical: No cervical adenopathy.  Skin:    General: Skin is warm and dry.     Findings: No rash.  Neurological:     Mental Status: He is alert and oriented to person, place, and time.     Coordination: Coordination normal.  Psychiatric:        Behavior: Behavior normal.      Assessment & Plan:   Problem List Items Addressed This Visit       Cardiovascular and Mediastinum   Hypertension - Primary   Relevant Orders   CMP14+EGFR   CBC with Differential/Platelet   Lipid panel     Other   Prediabetes   Relevant Orders   CMP14+EGFR   Bayer DCA Hb A1c Waived   Hyperlipidemia LDL goal <130   Relevant Orders   CMP14+EGFR   CBC with Differential/Platelet   Lipid panel    Continue current medicine.  We will check blood work Follow up plan: Return in about 6 months (around 05/29/2022), or if symptoms worsen or fail to improve, for Physical and prediabetes and hypertension and cholesterol.  Counseling provided for all of the vaccine components Orders Placed This Encounter  Procedures   CMP14+EGFR   CBC with Differential/Platelet   Lipid panel   Bayer DCA Hb A1c Doran, MD [facility] 11/29/2021, 4:04 PM

## 2021-11-30 LAB — LIPID PANEL
Chol/HDL Ratio: 2.6 ratio (ref 0.0–5.0)
Cholesterol, Total: 111 mg/dL (ref 100–199)
HDL: 43 mg/dL (ref >39)
LDL Chol Calc (NIH): 51 mg/dL (ref 0–99)
Triglycerides: 88 mg/dL (ref 0–149)
VLDL Cholesterol Cal: 17 mg/dL (ref 5–40)

## 2021-11-30 LAB — CMP14+EGFR
ALT: 10 IU/L (ref 0–44)
AST: 17 IU/L (ref 0–40)
Albumin/Globulin Ratio: 1.6 (ref 1.2–2.2)
Albumin: 4.1 g/dL (ref 3.7–4.7)
Alkaline Phosphatase: 84 IU/L (ref 44–121)
BUN/Creatinine Ratio: 9 — ABNORMAL LOW (ref 10–24)
BUN: 10 mg/dL (ref 8–27)
Bilirubin Total: 0.4 mg/dL (ref 0.0–1.2)
CO2: 24 mmol/L (ref 20–29)
Calcium: 9.1 mg/dL (ref 8.6–10.2)
Chloride: 103 mmol/L (ref 96–106)
Creatinine, Ser: 1.17 mg/dL (ref 0.76–1.27)
Globulin, Total: 2.6 g/dL (ref 1.5–4.5)
Glucose: 124 mg/dL — ABNORMAL HIGH (ref 70–99)
Potassium: 4.2 mmol/L (ref 3.5–5.2)
Sodium: 140 mmol/L (ref 134–144)
Total Protein: 6.7 g/dL (ref 6.0–8.5)
eGFR: 65 mL/min/{1.73_m2} (ref >59)

## 2021-11-30 LAB — CBC WITH DIFFERENTIAL/PLATELET
Basophils Absolute: 0 10*3/uL (ref 0.0–0.2)
Basos: 0 %
EOS (ABSOLUTE): 0.1 10*3/uL (ref 0.0–0.4)
Eos: 1 %
Hematocrit: 48.6 % (ref 37.5–51.0)
Hemoglobin: 16 g/dL (ref 13.0–17.7)
Immature Grans (Abs): 0 10*3/uL (ref 0.0–0.1)
Immature Granulocytes: 0 %
Lymphocytes Absolute: 2.3 10*3/uL (ref 0.7–3.1)
Lymphs: 25 %
MCH: 29.7 pg (ref 26.6–33.0)
MCHC: 32.9 g/dL (ref 31.5–35.7)
MCV: 90 fL (ref 79–97)
Monocytes Absolute: 0.7 10*3/uL (ref 0.1–0.9)
Monocytes: 7 %
Neutrophils Absolute: 6 10*3/uL (ref 1.4–7.0)
Neutrophils: 67 %
Platelets: 229 10*3/uL (ref 150–450)
RBC: 5.39 x10E6/uL (ref 4.14–5.80)
RDW: 12.4 % (ref 11.6–15.4)
WBC: 9.1 10*3/uL (ref 3.4–10.8)

## 2021-12-03 NOTE — Telephone Encounter (Addendum)
Received fax from [facility] for [facility] patient assistance, patient's application has been DENIED due to reported income exceeding program eligibility limits.    Per letter, this decision is final. Denial letter will be sent to scan center.

## 2021-12-05 ENCOUNTER — Other Ambulatory Visit: Payer: Self-pay

## 2021-12-05 ENCOUNTER — Ambulatory Visit (HOSPITAL_COMMUNITY)
Admission: RE | Admit: 2021-12-05 | Discharge: 2021-12-05 | Disposition: A | Discharge status: Home / Self Care | Payer: Medicare Other | Source: Ambulatory Visit | Attending: Pulmonary Disease | Admitting: Pulmonary Disease

## 2021-12-05 DIAGNOSIS — J84112 Idiopathic pulmonary fibrosis: Secondary | ICD-10-CM | POA: Diagnosis not present

## 2021-12-05 DIAGNOSIS — S2241XA Multiple fractures of ribs, right side, initial encounter for closed fracture: Secondary | ICD-10-CM | POA: Diagnosis not present

## 2021-12-05 DIAGNOSIS — J432 Centrilobular emphysema: Secondary | ICD-10-CM | POA: Diagnosis not present

## 2021-12-05 DIAGNOSIS — I2510 Atherosclerotic heart disease of native coronary artery without angina pectoris: Secondary | ICD-10-CM | POA: Diagnosis not present

## 2021-12-10 ENCOUNTER — Ambulatory Visit (INDEPENDENT_AMBULATORY_CARE_PROVIDER_SITE_OTHER): Payer: Medicare Other | Admitting: Pulmonary Disease

## 2021-12-10 ENCOUNTER — Encounter: Payer: Self-pay | Admitting: Pulmonary Disease

## 2021-12-10 ENCOUNTER — Other Ambulatory Visit: Payer: Self-pay

## 2021-12-10 VITALS — BP 112/78 | HR 94 | Temp 98.7°F | Ht 71.0 in | Wt 194.4 lb

## 2021-12-10 DIAGNOSIS — J9611 Chronic respiratory failure with hypoxia: Secondary | ICD-10-CM | POA: Insufficient documentation

## 2021-12-10 DIAGNOSIS — J84112 Idiopathic pulmonary fibrosis: Secondary | ICD-10-CM

## 2021-12-10 MED ORDER — ANORO ELLIPTA 62.5-25 MCG/ACT IN AEPB: 1.0000 | INHALATION_SPRAY | Freq: Every day | RESPIRATORY_TRACT | 0 refills | Status: DC

## 2021-12-10 NOTE — Assessment & Plan Note (Signed)
Although HRCT does not show disease progression, he does desaturate on exertion now and his DLCO is dropped on PFTs.  To me this does seem like disease progression. ?He does not want to go on Ofev anymore.  We discussed alternative medication pirfenidone and side effects related to this.  He does not want to trial this either. ?We discussed chances of disease progression without antifibrotic's and he is willing to take that risk rather than have side effects ? ? ?

## 2021-12-10 NOTE — Patient Instructions (Signed)
We discussed alternative medication - perfenidone ? ? ?X Ambulatory sat ? ?X Trial of Anoro ?

## 2021-12-10 NOTE — Progress Notes (Signed)
? ?  Subjective:  ? ? Patient ID: Christopher Mata, male    DOB: October 24, 1945, 76 y.o.   MRN: [id number] ? ?HPI ? 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  Started on ofev 06/2020 ?PMH - hiatal hernia surgery in 2016 which was complicated by a 7-day hospital stay requiring left-sided thoracentesis, CT chest from then showed minimal basal scarring.   ?  ?Admitted 04/2020 with Syncope and persistent hypotension ? alpha gal reaction--patient denies eating red meat,  he did have tuna, AKI - resolved ? ?Chief Complaint  ?Patient presents with  ? Follow-up  ?  Breathing is unchanged since the last visit. No new co's.   ? ? ?-experiences diarrhea intermittently with Ofev ? OFEV patient assistance application was DENIED  ?He stopped using Ofev for 3 weeks now, last LFTs reviewed were normal. ?He states that his belly is breathing better.  Breathing is no worse. ?We reviewed HRCT and PFTs today-DLCO is decreased compared to prior. ? ?On ambulation he desats to 88% ? ? ?Significant tests/ events reviewed ? ?Ambulatory saturation 12/2021 >>  desaturates to 88% and recovers with oxygen ? 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 ? ?HRCT 02/2021 stable UIP pattern compared to 2021 ?  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 Findings are consistent with UIP  ?  ?CT angiogram 03/2015 minimal basal scarring ?  ?PFTs 10/2021 ratio 78, FEV1 67%, FVC 62%/2.65, improved to 2.86 , TLC 64%, DLCO 9.42/37%, dropped from 13.3 ? ?PFTs 05/2020 moderate to severe restriction, ratio 76, FEV1 57%, FVC 55%, TLC 62%, DLCO 52%, no bronchodilator response ? ?Review of Systems ?neg for any significant sore throat, dysphagia, itching, sneezing, nasal congestion or excess/ purulent secretions, fever, chills, sweats,  unintended wt loss, pleuritic or exertional cp, hempoptysis, orthopnea pnd or change in chronic leg swelling. Also denies presyncope, palpitations, heartburn, abdominal pain, nausea, vomiting, diarrhea or change in bowel or urinary habits, dysuria,hematuria, rash, arthralgias, visual complaints, headache, numbness weakness or ataxia. ? ?   ?Objective:  ? Physical Exam ? ?Gen. Pleasant, well-nourished, in no distress ?ENT - no thrush, no pallor/icterus,no post nasal drip ?Neck: No JVD, no thyromegaly, no carotid bruits ?Lungs: no use of accessory muscles, no dullness to percussion, bibasal rales no rhonchi  ?Cardiovascular: Rhythm regular, heart sounds  normal, no murmurs or gallops, no peripheral edema ?Musculoskeletal: No deformities, no cyanosis or clubbing  ? ? ? ?   ?Assessment & Plan:  ? ? ?

## 2021-12-10 NOTE — Assessment & Plan Note (Signed)
We will send a prescription for portable oxygen concentrator to [facility] at 2 L to use during exertion ?

## 2022-02-26 ENCOUNTER — Encounter: Payer: Self-pay | Admitting: Nurse Practitioner

## 2022-02-26 ENCOUNTER — Ambulatory Visit (INDEPENDENT_AMBULATORY_CARE_PROVIDER_SITE_OTHER): Payer: Medicare Other | Admitting: Nurse Practitioner

## 2022-02-26 VITALS — BP 124/84 | HR 83 | Temp 98.0°F | Resp 20 | Ht 71.0 in | Wt 196.0 lb

## 2022-02-26 DIAGNOSIS — H65192 Other acute nonsuppurative otitis media, left ear: Secondary | ICD-10-CM

## 2022-02-26 MED ORDER — FLUTICASONE PROPIONATE 50 MCG/ACT NA SUSP: 2.0000 | Freq: Every day | NASAL | 6 refills | Status: DC

## 2022-02-26 NOTE — Progress Notes (Signed)
   Subjective:    Patient ID: Christopher Mata, male    DOB: 1946-08-18, 76 y.o.   MRN: [id number]   Chief Complaint: Ear Pain (Left ear/)   HPI  Patient come sin today c/o left ear pian. Has occasional  dizzy episodes. Had vertigo for a week , 2 weeks ago. That has gotten better. Now ear just hurts. Almost feels like something is in it.He has a constant runny nose that he thinks is allergy related. He refues any allergy medication.   Review of Systems  Constitutional:  Negative for diaphoresis.  HENT:  Positive for congestion, postnasal drip and rhinorrhea. Negative for sinus pressure and sinus pain.   Eyes:  Negative for pain.  Respiratory:  Negative for shortness of breath.   Cardiovascular:  Negative for chest pain, palpitations and leg swelling.  Gastrointestinal:  Negative for abdominal pain.  Endocrine: Negative for polydipsia.  Skin:  Negative for rash.  Neurological:  Positive for dizziness (mild). Negative for weakness and headaches.  Hematological:  Does not bruise/bleed easily.  All other systems reviewed and are negative.     Objective:   Physical Exam Constitutional:      Appearance: Normal appearance.  HENT:     Right Ear: Tympanic membrane normal.     Left Ear: A middle ear effusion is present.  Cardiovascular:     Rate and Rhythm: Normal rate and regular rhythm.     Heart sounds: Normal heart sounds.  Pulmonary:     Effort: Pulmonary effort is normal.     Breath sounds: Normal breath sounds.  Skin:    General: Skin is warm.  Neurological:     General: No focal deficit present.     Mental Status: He is alert and oriented to person, place, and time.  Psychiatric:        Mood and Affect: Mood normal.        Behavior: Behavior normal.   BP 124/84   Pulse 83   Temp 98 F (36.7 C) (Temporal)   Resp 20   Ht 5\' 11"  (1.803 m)   Wt 196 lb (88.9 kg)   SpO2 99%   BMI 27.34 kg/m         Assessment & Plan:   Christopher Mata in today with chief  complaint of Ear Pain (Left ear/)   1. Acute effusion of left ear Force fluids OTC decongestant - fluticasone (FLONASE) 50 MCG/ACT nasal spray; Place 2 sprays into both nostrils daily.  Dispense: 16 g; Refill: 6    The above assessment and management plan was discussed with the patient. The patient verbalized understanding of and has agreed to the management plan. Patient is aware to call the clinic if symptoms persist or worsen. Patient is aware when to return to the clinic for a follow-up visit. Patient educated on when it is appropriate to go to the emergency department.   Mary-Margaret Hassell Done, FNP

## 2022-02-26 NOTE — Patient Instructions (Signed)
Otitis Media With Effusion, Adult  Otitis media with effusion (OME) is inflammation and fluid (effusion) in the middle ear without having an ear infection. The middle ear is the space behind the eardrum. The middle ear is connected to the back of the throat by a narrow tube (eustachian tube). Normally the eustachian tube drains fluid out of the middle ear. A swollen eustachian tube can become blocked and cause fluid to collect in the middle ear. OME often goes away without treatment. Sometimes OME can lead to hearing problems and recurrent acute ear infections (acute otitis media). These conditions may require treatment. What are the causes? OME is caused by a blocked eustachian tube. This can result from: Allergies. Upper respiratory infections. Enlarged adenoids. The adenoids are areas of soft tissue located high in the back of the throat, behind the nose and the roof of the mouth. They are part of the body's natural defense system (immune system). Rapid changes in pressure, like when an airplane is descending or during scuba diving. In some cases, the cause of this condition is not known. What are the signs or symptoms? Common symptoms of this condition include: A feeling of fullness in your ear. Decreased hearing in the affected ear. Fluid draining into the ear canal. Pain in the ear. In some cases, there are no symptoms. How is this diagnosed?  A health care provider can diagnose OME based on signs and symptoms of the condition. Your provider will also do a physical exam to check for fluid behind the eardrum. During the exam, your health care provider will use an instrument called an otoscope to look in your ear. Your health care provider may do other tests, such as: A hearing test. A tympanogram. This is a test that shows how well the eardrum moves in response to air pressure in the ear canal. It provides a graph for your health care provider to review. A pneumatic otoscopy. This is a  test to check how your eardrum moves in response to changes in pressure. It is done by squeezing a small amount of air into the ear. How is this treated? Treatment for OME depends on the cause of the condition and the severity of symptoms. The first step is often waiting to see if the fluid drains on its own in a few weeks. Home care treatment may include: Over-the-counter pain relievers. A warm, moist cloth placed over the ear. Severe cases may require a procedure to insert tubes in the ears (tympanostomy tubes) to drain the fluid. Follow these instructions at home: Take over-the-counter and prescription medicines only as told by your health care provider. Keep all follow-up visits. Contact a health care provider if: You have pain that gets worse. Hearing in your affected ear gets worse. You have fluid draining from your ear canal. You have dizziness. You develop a fever. Get help right away if: You develop a severe headache. You completely lose hearing in the affected ear. You have bleeding from your ear canal. You have sudden and severe pain in your ear. These symptoms may represent a serious problem that is an emergency. Do not wait to see if the symptoms will go away. Get medical help right away. Call your local emergency services (911 in the [place] Do not drive yourself to the hospital. Summary Otitis media with effusion (OME) is inflammation and fluid (effusion) in the middle ear without having an ear infection. A swollen eustachian tube can become blocked and cause fluid to collect in the   middle ear. Treatment for OME depends on the cause of the condition and the severity of symptoms. Many times, treatment is not needed because the fluid drains on its own in a few weeks. Sometimes OME can lead to hearing problems and recurrent acute ear infections (acute otitis media), which may require treatment. This information is not intended to replace advice given to you by your health care  provider. Make sure you discuss any questions you have with your health care provider. Document Revised: 01/18/2021 Document Reviewed: 01/18/2021 Elsevier Patient Education  2023 Elsevier Inc.  

## 2022-04-30 ENCOUNTER — Ambulatory Visit (INDEPENDENT_AMBULATORY_CARE_PROVIDER_SITE_OTHER): Payer: Medicare Other | Admitting: Pulmonary Disease

## 2022-04-30 ENCOUNTER — Encounter: Payer: Self-pay | Admitting: Pulmonary Disease

## 2022-04-30 VITALS — BP 132/82 | HR 78 | Temp 98.4°F | Ht 71.0 in | Wt 195.2 lb

## 2022-04-30 DIAGNOSIS — J84112 Idiopathic pulmonary fibrosis: Secondary | ICD-10-CM | POA: Diagnosis not present

## 2022-04-30 DIAGNOSIS — J9611 Chronic respiratory failure with hypoxia: Secondary | ICD-10-CM | POA: Diagnosis not present

## 2022-04-30 MED ORDER — PREDNISONE 5 MG PO TABS: ORAL_TABLET | ORAL | 0 refills | Status: AC

## 2022-04-30 NOTE — Assessment & Plan Note (Signed)
He seems to have disease progression.  He did not tolerate Ofev and does not absolutely want to go back on this.  I discussed pirfenidone both last visit and this visit and he does not want to start this medication either. I discussed research trials and he does not want to participate. I explained to him course of IPF and that this could worsen with a flare. We will try a course of prednisone to help him with the cough.  I have also asked him to trial Delsym OTC.  He states that he has a constant sinus drip and has tried every medication for this

## 2022-04-30 NOTE — Progress Notes (Signed)
   Subjective:    Patient ID: Christopher Mata, male    DOB: October 30, 1945, 76 y.o.   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  Started on ofev 06/2020 PMH - hiatal hernia surgery in 2016 which was complicated by a 7-day hospital stay requiring left-sided thoracentesis, CT chest from then showed minimal basal scarring.     Admitted 04/2020 with Syncope and persistent hypotension ? alpha gal reaction--patient denies eating red meat,  he did have tuna, AKI - resolved   Chief Complaint  Patient presents with   Follow-up    Feels breathing has worsened.  Has not gotten anoro inhaler.     12/2021 stopped ofev due to side effects, did not want to trial perfenidone Trial of anoro sample >> he tried 4 puffs and did not feel that this worked Retail buyer with exertion  >> we sent POC order to [facility], discussed $2700 so he did not want to get this.  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 Findings are consistent with UIP    CT angiogram 03/2015 minimal basal scarring   PFTs 10/2021 ratio 78, FEV1 67%, FVC 62%/2.65, improved to 2.86 , TLC 64%, DLCO 9.42/37%, dropped from 13.3   PFTs 05/2020 moderate to severe restriction, ratio 76, FEV1 57%, FVC 55%, TLC 62%, DLCO 52%, no bronchodilator response  Review of Systems neg for any significant sore throat, dysphagia, itching, sneezing, nasal congestion or excess/ purulent secretions,  fever, chills, sweats, unintended wt loss, pleuritic or exertional cp, hempoptysis, orthopnea pnd or change in chronic leg swelling. Also denies presyncope, palpitations, heartburn, abdominal pain, nausea, vomiting, diarrhea or change in bowel or urinary habits, dysuria,hematuria, rash, arthralgias, visual complaints, headache, numbness weakness or ataxia.     Objective:   Physical Exam   Gen. Pleasant, well-nourished, in no distress ENT - no thrush, no pallor/icterus,no post nasal drip Neck: No JVD, no thyromegaly, no carotid bruits Lungs: no use of accessory muscles, no dullness to percussion, bibasal one third dry crackles Cardiovascular: Rhythm regular, heart sounds  normal, no murmurs or gallops, no peripheral edema Musculoskeletal: No deformities, no cyanosis or clubbing         Assessment & Plan:

## 2022-04-30 NOTE — Assessment & Plan Note (Signed)
He was noted to desaturate on exertion last visit. We will send prescription for oxygen to DME and see if they can provide him with a portable concentrator.  He does not want to pay a lot of money for this and he does not want to carry a tank around with him at all times. I discussed the dangers of chronic hypoxia

## 2022-04-30 NOTE — Patient Instructions (Addendum)
  X Prednisone 5 mg tabs  Take 2 tabs daily with food x 10 ds, then 1 tab daily with food x 10 ds then STOP  Try Delsym cough syrup 5 ml twice daily  X we will send Rx for POC 2L to [facility]

## 2022-07-10 ENCOUNTER — Ambulatory Visit (INDEPENDENT_AMBULATORY_CARE_PROVIDER_SITE_OTHER): Payer: Medicare Other | Admitting: Family Medicine

## 2022-07-10 ENCOUNTER — Encounter: Payer: Self-pay | Admitting: Family Medicine

## 2022-07-10 VITALS — BP 144/104 | HR 104 | Temp 98.3°F | Ht 71.0 in | Wt 195.0 lb

## 2022-07-10 DIAGNOSIS — E785 Hyperlipidemia, unspecified: Secondary | ICD-10-CM

## 2022-07-10 DIAGNOSIS — N401 Enlarged prostate with lower urinary tract symptoms: Secondary | ICD-10-CM

## 2022-07-10 DIAGNOSIS — R7303 Prediabetes: Secondary | ICD-10-CM | POA: Diagnosis not present

## 2022-07-10 DIAGNOSIS — R351 Nocturia: Secondary | ICD-10-CM

## 2022-07-10 DIAGNOSIS — I10 Essential (primary) hypertension: Secondary | ICD-10-CM

## 2022-07-10 LAB — BAYER DCA HB A1C WAIVED: HB A1C (BAYER DCA - WAIVED): 6.3 % — ABNORMAL HIGH (ref 4.8–5.6)

## 2022-07-10 MED ORDER — ATORVASTATIN CALCIUM 20 MG PO TABS: 20.0000 mg | ORAL_TABLET | Freq: Every day | ORAL | 3 refills | Status: DC

## 2022-07-10 MED ORDER — FINASTERIDE 5 MG PO TABS: 5.0000 mg | ORAL_TABLET | Freq: Every day | ORAL | 3 refills | Status: DC

## 2022-07-10 MED ORDER — OMEPRAZOLE 20 MG PO CPDR: 20.0000 mg | DELAYED_RELEASE_CAPSULE | Freq: Every day | ORAL | 3 refills | Status: DC

## 2022-07-10 NOTE — Progress Notes (Signed)
BP (!) 144/104   Pulse (!) 104   Temp 98.3 F (36.8 C)   Ht 5' 11"  (1.803 m)   Wt 195 lb (88.5 kg)   SpO2 95%   BMI 27.20 kg/m    Subjective:   Patient ID: Christopher Mata, male    DOB: Feb 18, 1946, 76 y.o.   MRN: [id number]  HPI: Christopher Mata is a 76 y.o. male presenting on 07/10/2022 for Medical Management of Chronic Issues, Hypertension, and Gastroesophageal Reflux   HPI Hypertension Patient is currently on no medication currently, and their blood pressure today is 144/104 and 134/92. Patient denies any lightheadedness or dizziness. Patient denies headaches, blurred vision, chest pains, shortness of breath, or weakness. Denies any side effects from medication and is content with current medication.   Prediabetes Patient comes in today for recheck of his diabetes. Patient has been currently taking no medication currently. Patient is not currently on an ACE inhibitor/ARB. Patient has not seen an ophthalmologist this year. Patient denies any issues with their feet. The symptom started onset as an adult hypertension and hyperlipidemia ARE RELATED TO DM   Hyperlipidemia Patient is coming in for recheck of his hyperlipidemia. The patient is currently taking atorvastatin. They deny any issues with myalgias or history of liver damage from it. They deny any focal numbness or weakness or chest pain.   BPH Patient is coming in for recheck on BPH Symptoms: Urinary frequency and nocturia Medication: Finasteride Last PSA: 1 year ago  Relevant past medical, surgical, family and social history reviewed and updated as indicated. Interim medical history since our last visit reviewed. Allergies and medications reviewed and updated.  Review of Systems  Constitutional:  Negative for chills and fever.  Eyes:  Negative for visual disturbance.  Respiratory:  Positive for cough (Patient has pulmonary fibrosis) and shortness of breath. Negative for wheezing.   Cardiovascular:  Negative for  chest pain and leg swelling.  Genitourinary:  Positive for frequency.  Musculoskeletal:  Negative for back pain and gait problem.  Skin:  Negative for rash.  Neurological:  Negative for dizziness, weakness and numbness.  All other systems reviewed and are negative.   Per HPI unless specifically indicated above   Allergies as of 07/10/2022   No Active Allergies      Medication List        Accurate as of July 10, 2022  2:38 PM. If you have any questions, ask your nurse or doctor.          atorvastatin 20 MG tablet Commonly known as: LIPITOR Take 1 tablet (20 mg total) by mouth at bedtime.   finasteride 5 MG tablet Commonly known as: PROSCAR Take 1 tablet (5 mg total) by mouth daily.   omeprazole 20 MG capsule Commonly known as: PRILOSEC Take 1 capsule (20 mg total) by mouth daily.         Objective:   BP (!) 144/104   Pulse (!) 104   Temp 98.3 F (36.8 C)   Ht 5' 11"  (1.803 m)   Wt 195 lb (88.5 kg)   SpO2 95%   BMI 27.20 kg/m   Wt Readings from Last 3 Encounters:  07/10/22 195 lb (88.5 kg)  04/30/22 195 lb 3.2 oz (88.5 kg)  02/26/22 196 lb (88.9 kg)    Physical Exam Vitals and nursing note reviewed.  Constitutional:      General: He is not in acute distress.    Appearance: He is well-developed. He is not diaphoretic.  Eyes:     General: No scleral icterus.    Conjunctiva/sclera: Conjunctivae normal.  Neck:     Thyroid: No thyromegaly.  Cardiovascular:     Rate and Rhythm: Normal rate and regular rhythm.     Heart sounds: Normal heart sounds. No murmur heard. Pulmonary:     Effort: Pulmonary effort is normal. No respiratory distress.     Breath sounds: Rhonchi present. No wheezing.  Musculoskeletal:        General: No swelling. Normal range of motion.     Cervical back: Neck supple.  Lymphadenopathy:     Cervical: No cervical adenopathy.  Skin:    General: Skin is warm and dry.     Findings: No rash.  Neurological:     Mental Status:  He is alert and oriented to person, place, and time.     Coordination: Coordination normal.  Psychiatric:        Behavior: Behavior normal.       Assessment & Plan:   Problem List Items Addressed This Visit       Cardiovascular and Mediastinum   Hypertension - Primary   Relevant Medications   atorvastatin (LIPITOR) 20 MG tablet   Other Relevant Orders   CBC with Differential/Platelet   CMP14+EGFR   Lipid panel   Bayer DCA Hb A1c Waived     Other   Prediabetes   Relevant Orders   Bayer DCA Hb A1c Waived   Hyperlipidemia LDL goal <130   Relevant Medications   atorvastatin (LIPITOR) 20 MG tablet   Other Relevant Orders   CBC with Differential/Platelet   CMP14+EGFR   Lipid panel   Bayer DCA Hb A1c Waived   Benign prostatic hyperplasia with nocturia   Relevant Orders   PSA, total and free    Patient will keep an eye on blood pressures for the next 2 weeks and bring a list of some numbers by or call it in.  Patient says he does not go to see pulmonology anymore because he cannot afford the fibrosis medicines and may cause him diarrhea and he does not like to take them anyways.  We will check blood work today, otherwise patient looks good Follow up plan: Return in about 6 months (around 01/09/2023), or if symptoms worsen or fail to improve, for Prediabetes hypertension and cholesterol.  Counseling provided for all of the vaccine components Orders Placed This Encounter  Procedures   CBC with Differential/Platelet   CMP14+EGFR   Lipid panel   Bayer DCA Hb A1c Waived   PSA, total and free    Caryl Pina, MD [facility] 07/10/2022, 2:38 PM

## 2022-07-11 LAB — CBC WITH DIFFERENTIAL/PLATELET
Basophils Absolute: 0.1 10*3/uL (ref 0.0–0.2)
Basos: 1 %
EOS (ABSOLUTE): 0.1 10*3/uL (ref 0.0–0.4)
Eos: 1 %
Hematocrit: 50.9 % (ref 37.5–51.0)
Hemoglobin: 17.2 g/dL (ref 13.0–17.7)
Immature Grans (Abs): 0.1 10*3/uL (ref 0.0–0.1)
Immature Granulocytes: 1 %
Lymphocytes Absolute: 2.3 10*3/uL (ref 0.7–3.1)
Lymphs: 21 %
MCH: 29.8 pg (ref 26.6–33.0)
MCHC: 33.8 g/dL (ref 31.5–35.7)
MCV: 88 fL (ref 79–97)
Monocytes Absolute: 0.8 10*3/uL (ref 0.1–0.9)
Monocytes: 7 %
Neutrophils Absolute: 7.9 10*3/uL — ABNORMAL HIGH (ref 1.4–7.0)
Neutrophils: 69 %
Platelets: 234 10*3/uL (ref 150–450)
RBC: 5.77 x10E6/uL (ref 4.14–5.80)
RDW: 13.1 % (ref 11.6–15.4)
WBC: 11.1 10*3/uL — ABNORMAL HIGH (ref 3.4–10.8)

## 2022-07-11 LAB — CMP14+EGFR
ALT: 14 IU/L (ref 0–44)
AST: 17 IU/L (ref 0–40)
Albumin/Globulin Ratio: 1.2 (ref 1.2–2.2)
Albumin: 4.2 g/dL (ref 3.8–4.8)
Alkaline Phosphatase: 91 IU/L (ref 44–121)
BUN/Creatinine Ratio: 7 — ABNORMAL LOW (ref 10–24)
BUN: 8 mg/dL (ref 8–27)
Bilirubin Total: 0.6 mg/dL (ref 0.0–1.2)
CO2: 22 mmol/L (ref 20–29)
Calcium: 9.4 mg/dL (ref 8.6–10.2)
Chloride: 96 mmol/L (ref 96–106)
Creatinine, Ser: 1.16 mg/dL (ref 0.76–1.27)
Globulin, Total: 3.4 g/dL (ref 1.5–4.5)
Glucose: 113 mg/dL — ABNORMAL HIGH (ref 70–99)
Potassium: 3.9 mmol/L (ref 3.5–5.2)
Sodium: 137 mmol/L (ref 134–144)
Total Protein: 7.6 g/dL (ref 6.0–8.5)
eGFR: 66 mL/min/{1.73_m2} (ref >59)

## 2022-07-11 LAB — LIPID PANEL
Chol/HDL Ratio: 2.1 ratio (ref 0.0–5.0)
Cholesterol, Total: 120 mg/dL (ref 100–199)
HDL: 56 mg/dL (ref >39)
LDL Chol Calc (NIH): 47 mg/dL (ref 0–99)
Triglycerides: 90 mg/dL (ref 0–149)
VLDL Cholesterol Cal: 17 mg/dL (ref 5–40)

## 2022-07-11 LAB — PSA, TOTAL AND FREE
PSA, Free Pct: 35 %
PSA, Free: 0.07 ng/mL
Prostate Specific Ag, Serum: 0.2 ng/mL (ref 0.0–4.0)

## 2022-07-26 ENCOUNTER — Telehealth: Payer: Self-pay

## 2022-07-26 NOTE — Telephone Encounter (Signed)
Pt brought BP readings by the office. Per [place] readings are about 50/50. Spoke with pts wife and she was informed and will have pot continue to monitor and will discuss at next visit.  BP readings average about 130/90  152/109-- is the highest reading

## 2022-08-07 ENCOUNTER — Telehealth: Payer: Self-pay | Admitting: Pulmonary Disease

## 2022-08-07 DIAGNOSIS — J84112 Idiopathic pulmonary fibrosis: Secondary | ICD-10-CM

## 2022-08-07 DIAGNOSIS — J9611 Chronic respiratory failure with hypoxia: Secondary | ICD-10-CM

## 2022-08-07 NOTE — Telephone Encounter (Signed)
Received a call from [facility] stating that they had not received fax on pt. PCCs, please advise if you know anything about this.

## 2022-08-07 NOTE — Telephone Encounter (Signed)
New order placed to be sent to [facility]

## 2022-08-07 NOTE — Telephone Encounter (Signed)
Christopher Mata w/[facility] needs order for [name].  Fax:  [phone].  Please advise.  Pt's wife, Christopher Mata [phone]

## 2022-08-07 NOTE — Telephone Encounter (Signed)
Rx was faxed originally to the office 10/26. Call back number is [phone] ext [phone].

## 2022-08-08 ENCOUNTER — Telehealth: Payer: Self-pay | Admitting: Pulmonary Disease

## 2022-08-09 NOTE — Telephone Encounter (Signed)
Dylan called back and states that what was received was an unsigned referral. Christopher Mata is faxing a order that needs to be signed by Dr. Elsworth Soho to [phone]. Please advise

## 2022-08-09 NOTE — Telephone Encounter (Signed)
Order reprinted and faxed to [facility].

## 2022-08-09 NOTE — Telephone Encounter (Signed)
Received [facility] form. Filled out and signed by Dr. Elsworth Soho. Faxed back to [facility] at [phone]. Nothing further needed

## 2022-08-09 NOTE — Telephone Encounter (Signed)
Dylan calling back stating no fax order was received. Please refax to [phone] (direct fax to [facility]) and update Dylan at [phone] ext [phone].

## 2022-08-09 NOTE — Telephone Encounter (Signed)
Faxed to [phone]  Confirmation received

## 2022-09-24 DIAGNOSIS — B070 Plantar wart: Secondary | ICD-10-CM | POA: Diagnosis not present

## 2022-09-24 DIAGNOSIS — M79671 Pain in right foot: Secondary | ICD-10-CM | POA: Diagnosis not present

## 2022-10-16 ENCOUNTER — Ambulatory Visit (INDEPENDENT_AMBULATORY_CARE_PROVIDER_SITE_OTHER): Payer: Medicare Other | Admitting: Pulmonary Disease

## 2022-10-16 ENCOUNTER — Encounter (HOSPITAL_BASED_OUTPATIENT_CLINIC_OR_DEPARTMENT_OTHER): Payer: Self-pay | Admitting: Pulmonary Disease

## 2022-10-16 VITALS — BP 108/68 | HR 105 | Temp 97.6°F | Ht 71.0 in | Wt 190.2 lb

## 2022-10-16 DIAGNOSIS — J9611 Chronic respiratory failure with hypoxia: Secondary | ICD-10-CM

## 2022-10-16 DIAGNOSIS — J84112 Idiopathic pulmonary fibrosis: Secondary | ICD-10-CM

## 2022-10-16 MED ORDER — AZITHROMYCIN 250 MG PO TABS: 250.0000 mg | ORAL_TABLET | Freq: Every day | ORAL | 0 refills | Status: DC

## 2022-10-16 MED ORDER — PREDNISONE 10 MG PO TABS: ORAL_TABLET | ORAL | 0 refills | Status: AC

## 2022-10-16 NOTE — Assessment & Plan Note (Addendum)
I explained that he may be going through an IPF flare.  He denies any URI symptoms at onset but it is possible that his worsening is related to postviral phenomenon.  Will treat him empirically with prednisone taper starting at 40 mg for 2 weeks and Z-Pak. I explained to him that if it is an IPF flare, prognosis would be guarded.  We once again discussed antifibrotic's.  He was unable to tolerate Ofev.  He is now willing to trial pirfenidone after a lot of discussion regarding side effect profile and cost.  We will initiate paperwork for this. We discussed poor prognosis for IPF flare

## 2022-10-16 NOTE — Patient Instructions (Signed)
X Zpak  X Prednisone 10 mg tabs Take 4 tabs  daily with food x 5 days, then 3 tabs daily x 5 days, then 2 tabs daily x 5 days, then 1 tab daily x5 days then stop. #50  X Rx for oxygen home unit to use at all times including sleep x 2 L/min  X we will start paperwork for [facility] -we discussed side effects including nausea and photosensitivity

## 2022-10-16 NOTE — Progress Notes (Signed)
   Subjective:    Patient ID: Christopher Mata, male    DOB: 15-Oct-1945, 77 y.o.   MRN: [id number]  HPI   77 yo remote smoker for FU of IPF He reports dyspnea on exertion worse x 2 years but dating back > 10 years.  Started on ofev 06/2020 PMH - hiatal hernia surgery in 5400 which was complicated by a 7-day hospital stay requiring left-sided thoracentesis, CT chest from then showed minimal basal scarring.     Admitted 04/2020 with Syncope and persistent hypotension ? alpha gal reaction--patient denies eating red meat,  he did have tuna, AKI - resolved  12/2021 stopped ofev due to side effects, did not want to trial perfenidone   Chief Complaint  Patient presents with  . Follow-up    Pt states he is having more SOB with exertion x 3- 4 weeks.      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 Findings are consistent with UIP    CT angiogram 03/2015 minimal basal scarring   PFTs 10/2021 ratio 78, FEV1 67%, FVC 62%/2.65, improved to 2.86 , TLC 64%, DLCO 9.42/37%, dropped from 13.3   PFTs 05/2020 moderate to severe restriction, ratio 76, FEV1 57%, FVC 55%, TLC 62%, DLCO 52%, no bronchodilator response   Review of Systems     Objective:   Physical Exam        Assessment & Plan:

## 2022-10-17 NOTE — Assessment & Plan Note (Signed)
He will qualify for an oxygen concentrator and if hypoxia worsens may need continuous oxygen in the future. We will send prescription to DME, his oxygen saturation 87% on room air today qualifies

## 2022-10-18 ENCOUNTER — Telehealth: Payer: Self-pay | Admitting: Pulmonary Disease

## 2022-10-18 NOTE — Telephone Encounter (Signed)
Patient called to request that his supplies go thru [employer] instead of [facility].  Please call to discuss at [phone]

## 2022-10-18 NOTE — Telephone Encounter (Signed)
Called Pt and he stated that he wanted to have his DME changed to [place]. I let [facility] ) know to change the order and nothing further is needed.

## 2022-10-22 DIAGNOSIS — J9611 Chronic respiratory failure with hypoxia: Secondary | ICD-10-CM | POA: Diagnosis not present

## 2022-10-22 DIAGNOSIS — J84112 Idiopathic pulmonary fibrosis: Secondary | ICD-10-CM | POA: Diagnosis not present

## 2022-11-06 ENCOUNTER — Encounter (HOSPITAL_BASED_OUTPATIENT_CLINIC_OR_DEPARTMENT_OTHER): Payer: Self-pay | Admitting: Pulmonary Disease

## 2022-11-11 ENCOUNTER — Telehealth: Payer: Self-pay | Admitting: Pharmacist

## 2022-11-11 DIAGNOSIS — J84112 Idiopathic pulmonary fibrosis: Secondary | ICD-10-CM

## 2022-11-11 NOTE — Telephone Encounter (Signed)
Pharmacy team please advise, patients papers were not faxed for Esbriet according to patient? Can you help with this?

## 2022-11-11 NOTE — Telephone Encounter (Signed)
Received notification from [place] that patient was supposed to be started on Esbriet/pirfenidone. Pharmacy team did not receive paperwork.  Submitted a Prior Authorization request to [facility] for ESBRIET (brand) via CoverMyMeds. Will update once we receive a response.  Key:  Viann Fish, PharmD, MPH, BCPS, CPP Clinical Pharmacist (Rheumatology and Pulmonology)

## 2022-11-13 NOTE — Telephone Encounter (Addendum)
Returned call to patient's daughter and advised of process to get her father enrolled into [place] patient assistance. Left VM advising of this and that we'll be in touch once we get an update. I did advise that patient will likely not be approved for program due to needing to trial generic .  Received prescriber form from [place]. Never received prescriber form from [place]. Submission is pending PA determination  Called [facility] to determine maximum out of pocket to notate on prescriber form as this is required now. Patient does not have deductible but have maximum OOP $5023.10 (rep unable to fax this info to clinic)  Knox Saliva, PharmD, MPH, BCPS, CPP Clinical Pharmacist (Rheumatology and Pulmonology)

## 2022-11-13 NOTE — Telephone Encounter (Signed)
Christopher Mata daughter checking on RX for Esbriet. Christopher Mata phone number is [phone].

## 2022-11-14 MED ORDER — PREDNISONE 10 MG PO TABS: ORAL_TABLET | ORAL | 0 refills | Status: AC

## 2022-11-14 NOTE — Telephone Encounter (Signed)
Called and spoke with Christopher Mata. She verbalized understanding of prednisone. RX has been sent to [facility] in [place].   Nothing further needed at time of call.

## 2022-11-14 NOTE — Telephone Encounter (Signed)
Lmtcb for [employer].

## 2022-11-14 NOTE — Telephone Encounter (Signed)
Pt's daughter, Lilia Pro, returned call. Lilia Pro states pt is having orthopnea x 2 nights, clear sinus drainage, chest congestion, prod cough in the morning (off white in color), non prod cough throughout the rest of day, increase in ShOB. Lilia Pro states the pt was definitely better while on zpak and pred (given at last OV on 10/16/22). After pt completed the abx and pred pt slowly began to have worsening symptoms. Lilia Pro states pt's symptoms noticeably worse over the last week. Pt wearing 2lpm 24/7 as directed. No fever, chest pain, swelling.   Dr. Elsworth Soho, please advise. Thanks.

## 2022-11-14 NOTE — Telephone Encounter (Signed)
Received a fax regarding Prior Authorization from [facility] for [place]. Authorization has been DENIED because patient must try PIRFENIDONE or provide specific medical reasons why the patient cannot take pirfenidone.  PA submitted for pirfenidone (generic). CMM Key: [id number]  Since patient has already submitted his part of [place] application, I've gone ahead and sent in provider portion. I anticipate the application will be denied since patient must try generic and program is not accepting this denial reason as for patient assistance qualification. Submitted Patient Assistance Application to [place] for [place] along with provider portion, PA denial, and med list. Will update patient when we- receive a response.  Fax# [phone] Phone# [phone]  Knox Saliva, PharmD, MPH, BCPS, CPP Clinical Pharmacist (Rheumatology and Pulmonology)

## 2022-11-21 ENCOUNTER — Other Ambulatory Visit (HOSPITAL_COMMUNITY): Payer: Self-pay

## 2022-11-21 NOTE — Telephone Encounter (Signed)
Received notification from [facility] regarding a prior authorization for PIRFENIDONE. Authorization has been APPROVED from 11/14/2022 to 10/07/2023. Approval letter sent to scan center.  Per test claim, copay for 30 days supply is $251.17  Patient can fill through [place]: [phone]   Authorization # [id number]  Spoke with patient's daughter Lilia Pro to discuss next steps. Reviewed that copay through insurance may change month to month. Also offered option for [place] and that copay would be consistently $250 per month with no significant fluctuations. She states that knowing her father, he is unlikely to be willing to pay this amount. She states he has had significant deterioration and only has some energy while on prednisone. She states she will discuss with her family this weekend and let us know what patient decides.  Patient assistance application has been submitted and is being followed through to completion but anticipate denial.  Knox Saliva, PharmD, MPH, BCPS, CPP Clinical Pharmacist (Rheumatology and Pulmonology)

## 2022-11-22 DIAGNOSIS — J9611 Chronic respiratory failure with hypoxia: Secondary | ICD-10-CM | POA: Diagnosis not present

## 2022-11-22 DIAGNOSIS — J84112 Idiopathic pulmonary fibrosis: Secondary | ICD-10-CM | POA: Diagnosis not present

## 2022-11-26 ENCOUNTER — Other Ambulatory Visit (HOSPITAL_COMMUNITY): Payer: Self-pay

## 2022-11-26 ENCOUNTER — Other Ambulatory Visit: Payer: Self-pay

## 2022-11-26 MED ORDER — PIRFENIDONE 267 MG PO TABS
ORAL_TABLET | ORAL | 0 refills | Status: DC
  Filled 2022-11-26: qty 207, fill #0
  Filled 2022-11-27: qty 207, 30d supply, fill #0

## 2022-11-26 MED ORDER — PIRFENIDONE 267 MG PO TABS
801.0000 mg | ORAL_TABLET | Freq: Three times a day (TID) | ORAL | 4 refills | Status: DC
  Filled 2022-11-26 – 2023-01-15 (×2): qty 270, 30d supply, fill #0
  Filled 2023-03-12 – 2023-03-18 (×2): qty 270, 30d supply, fill #1
  Filled 2023-03-19: qty 15, 2d supply, fill #1
  Filled 2023-03-19: qty 255, 28d supply, fill #1
  Filled 2023-04-08: qty 270, 30d supply, fill #2
  Filled 2023-05-01: qty 270, 30d supply, fill #3
  Filled 2023-06-02: qty 270, 30d supply, fill #4

## 2022-11-26 NOTE — Telephone Encounter (Signed)
Received message from patient's daughter Lilia Pro, stating that patient has decided to pay for pirfenidone through insurance. Rx for pirfenidone sent to [facility]. Routing to [place] M for onboarding need  Knox Saliva, PharmD, MPH, BCPS, CPP Clinical Pharmacist (Rheumatology and Pulmonology)

## 2022-11-27 ENCOUNTER — Other Ambulatory Visit (HOSPITAL_COMMUNITY): Payer: Self-pay

## 2022-11-27 ENCOUNTER — Other Ambulatory Visit: Payer: Self-pay

## 2022-11-27 NOTE — Telephone Encounter (Signed)
Delivery instructions have been updated in [place], medication will be shipped to patient's home address by 11/29/22.  Rx has been processed in [facility] and there is a copay of $193.63. Payment information has been collected and forwarded to the pharmacy.

## 2022-11-28 ENCOUNTER — Other Ambulatory Visit: Payer: Self-pay

## 2022-12-02 ENCOUNTER — Other Ambulatory Visit (HOSPITAL_COMMUNITY): Payer: Self-pay

## 2022-12-18 ENCOUNTER — Other Ambulatory Visit (HOSPITAL_COMMUNITY): Payer: Self-pay

## 2022-12-21 DIAGNOSIS — J84112 Idiopathic pulmonary fibrosis: Secondary | ICD-10-CM | POA: Diagnosis not present

## 2022-12-21 DIAGNOSIS — J9611 Chronic respiratory failure with hypoxia: Secondary | ICD-10-CM | POA: Diagnosis not present

## 2022-12-31 ENCOUNTER — Other Ambulatory Visit (HOSPITAL_COMMUNITY): Payer: Self-pay

## 2023-01-15 ENCOUNTER — Other Ambulatory Visit (HOSPITAL_COMMUNITY): Payer: Self-pay

## 2023-01-15 ENCOUNTER — Other Ambulatory Visit: Payer: Self-pay

## 2023-01-20 ENCOUNTER — Other Ambulatory Visit: Payer: Self-pay

## 2023-01-21 ENCOUNTER — Other Ambulatory Visit: Payer: Self-pay

## 2023-01-21 DIAGNOSIS — J84112 Idiopathic pulmonary fibrosis: Secondary | ICD-10-CM | POA: Diagnosis not present

## 2023-01-21 DIAGNOSIS — J9611 Chronic respiratory failure with hypoxia: Secondary | ICD-10-CM | POA: Diagnosis not present

## 2023-02-12 ENCOUNTER — Other Ambulatory Visit (HOSPITAL_COMMUNITY): Payer: Self-pay

## 2023-02-20 DIAGNOSIS — J9611 Chronic respiratory failure with hypoxia: Secondary | ICD-10-CM | POA: Diagnosis not present

## 2023-02-20 DIAGNOSIS — J84112 Idiopathic pulmonary fibrosis: Secondary | ICD-10-CM | POA: Diagnosis not present

## 2023-03-12 ENCOUNTER — Other Ambulatory Visit (HOSPITAL_COMMUNITY): Payer: Self-pay

## 2023-03-13 ENCOUNTER — Other Ambulatory Visit: Payer: Self-pay

## 2023-03-18 ENCOUNTER — Other Ambulatory Visit (HOSPITAL_COMMUNITY): Payer: Self-pay

## 2023-03-19 ENCOUNTER — Other Ambulatory Visit (HOSPITAL_COMMUNITY): Payer: Self-pay

## 2023-03-19 ENCOUNTER — Other Ambulatory Visit: Payer: Self-pay

## 2023-03-20 ENCOUNTER — Other Ambulatory Visit: Payer: Self-pay

## 2023-03-20 ENCOUNTER — Other Ambulatory Visit (HOSPITAL_COMMUNITY): Payer: Self-pay

## 2023-03-23 DIAGNOSIS — J9611 Chronic respiratory failure with hypoxia: Secondary | ICD-10-CM | POA: Diagnosis not present

## 2023-03-23 DIAGNOSIS — J84112 Idiopathic pulmonary fibrosis: Secondary | ICD-10-CM | POA: Diagnosis not present

## 2023-03-24 ENCOUNTER — Other Ambulatory Visit: Payer: Self-pay

## 2023-04-04 ENCOUNTER — Other Ambulatory Visit (HOSPITAL_COMMUNITY): Payer: Self-pay

## 2023-04-08 ENCOUNTER — Other Ambulatory Visit (HOSPITAL_COMMUNITY): Payer: Self-pay

## 2023-04-11 ENCOUNTER — Other Ambulatory Visit: Payer: Self-pay

## 2023-04-14 ENCOUNTER — Other Ambulatory Visit (HOSPITAL_COMMUNITY): Payer: Self-pay

## 2023-04-14 ENCOUNTER — Other Ambulatory Visit: Payer: Self-pay

## 2023-04-22 DIAGNOSIS — J84112 Idiopathic pulmonary fibrosis: Secondary | ICD-10-CM | POA: Diagnosis not present

## 2023-04-22 DIAGNOSIS — J9611 Chronic respiratory failure with hypoxia: Secondary | ICD-10-CM | POA: Diagnosis not present

## 2023-05-01 ENCOUNTER — Other Ambulatory Visit (HOSPITAL_COMMUNITY): Payer: Self-pay

## 2023-05-09 ENCOUNTER — Other Ambulatory Visit: Payer: Self-pay

## 2023-05-12 ENCOUNTER — Other Ambulatory Visit (HOSPITAL_COMMUNITY): Payer: Self-pay

## 2023-05-19 DIAGNOSIS — J84112 Idiopathic pulmonary fibrosis: Secondary | ICD-10-CM | POA: Diagnosis not present

## 2023-05-19 DIAGNOSIS — J9611 Chronic respiratory failure with hypoxia: Secondary | ICD-10-CM | POA: Diagnosis not present

## 2023-06-02 ENCOUNTER — Other Ambulatory Visit (HOSPITAL_COMMUNITY): Payer: Self-pay

## 2023-06-11 ENCOUNTER — Other Ambulatory Visit: Payer: Self-pay

## 2023-07-01 ENCOUNTER — Other Ambulatory Visit (HOSPITAL_COMMUNITY): Payer: Self-pay

## 2023-07-01 ENCOUNTER — Other Ambulatory Visit: Payer: Self-pay

## 2023-07-01 ENCOUNTER — Other Ambulatory Visit: Payer: Self-pay | Admitting: Pulmonary Disease

## 2023-07-01 DIAGNOSIS — J84112 Idiopathic pulmonary fibrosis: Secondary | ICD-10-CM

## 2023-07-01 NOTE — Progress Notes (Signed)
Specialty Pharmacy Refill Coordination Note  Christopher Mata is a 77 y.o. male contacted today regarding refills of specialty medication(s) Pirfenidone .  Patient requested Delivery  on 07/08/23  to verified address [address]   [place] [address]   Medication will be filled on 07/07/23 pending Rx refill.

## 2023-07-03 ENCOUNTER — Other Ambulatory Visit: Payer: Self-pay

## 2023-07-03 ENCOUNTER — Other Ambulatory Visit (HOSPITAL_COMMUNITY): Payer: Self-pay

## 2023-07-03 ENCOUNTER — Other Ambulatory Visit: Payer: Self-pay | Admitting: Pulmonary Disease

## 2023-07-03 DIAGNOSIS — J84112 Idiopathic pulmonary fibrosis: Secondary | ICD-10-CM

## 2023-07-03 MED ORDER — PIRFENIDONE 267 MG PO TABS
801.0000 mg | ORAL_TABLET | Freq: Three times a day (TID) | ORAL | 4 refills | Status: DC
  Filled 2023-07-03: qty 270, 30d supply, fill #0
  Filled 2023-07-24 – 2023-09-01 (×2): qty 270, 30d supply, fill #1
  Filled 2023-09-29: qty 270, 30d supply, fill #2
  Filled 2023-10-23: qty 270, 30d supply, fill #3
  Filled 2023-12-08: qty 270, 30d supply, fill #4

## 2023-07-07 ENCOUNTER — Other Ambulatory Visit: Payer: Self-pay

## 2023-07-15 ENCOUNTER — Other Ambulatory Visit: Payer: Self-pay | Admitting: Family Medicine

## 2023-07-15 ENCOUNTER — Encounter: Payer: Self-pay | Admitting: Family Medicine

## 2023-07-15 NOTE — Telephone Encounter (Signed)
LEFT MESSAGE TO SCHEDULE APPT AND WILL MAIL LETTER 

## 2023-07-15 NOTE — Telephone Encounter (Signed)
[place] NTBS Last OV 07/10/22 NO RF sent to pharmacy since last OV greater than a year

## 2023-07-17 ENCOUNTER — Other Ambulatory Visit: Payer: Self-pay | Admitting: Family Medicine

## 2023-07-18 ENCOUNTER — Other Ambulatory Visit: Payer: Self-pay | Admitting: Family Medicine

## 2023-07-24 ENCOUNTER — Other Ambulatory Visit (HOSPITAL_COMMUNITY): Payer: Self-pay

## 2023-07-29 ENCOUNTER — Other Ambulatory Visit: Payer: Self-pay | Admitting: Family Medicine

## 2023-08-07 ENCOUNTER — Other Ambulatory Visit: Payer: Self-pay

## 2023-08-19 ENCOUNTER — Other Ambulatory Visit: Payer: Self-pay

## 2023-08-21 ENCOUNTER — Ambulatory Visit: Payer: Medicare Other | Admitting: Family Medicine

## 2023-08-21 ENCOUNTER — Encounter: Payer: Self-pay | Admitting: Family Medicine

## 2023-08-21 VITALS — BP 128/83 | HR 110 | Ht 71.0 in | Wt 187.0 lb

## 2023-08-21 DIAGNOSIS — E785 Hyperlipidemia, unspecified: Secondary | ICD-10-CM

## 2023-08-21 DIAGNOSIS — K2100 Gastro-esophageal reflux disease with esophagitis, without bleeding: Secondary | ICD-10-CM | POA: Diagnosis not present

## 2023-08-21 DIAGNOSIS — I10 Essential (primary) hypertension: Secondary | ICD-10-CM

## 2023-08-21 DIAGNOSIS — R351 Nocturia: Secondary | ICD-10-CM

## 2023-08-21 DIAGNOSIS — N401 Enlarged prostate with lower urinary tract symptoms: Secondary | ICD-10-CM

## 2023-08-21 DIAGNOSIS — R7303 Prediabetes: Secondary | ICD-10-CM | POA: Diagnosis not present

## 2023-08-21 DIAGNOSIS — N529 Male erectile dysfunction, unspecified: Secondary | ICD-10-CM

## 2023-08-21 LAB — BAYER DCA HB A1C WAIVED: HB A1C (BAYER DCA - WAIVED): 5.4 % (ref 4.8–5.6)

## 2023-08-21 MED ORDER — SILDENAFIL CITRATE 100 MG PO TABS: 100.0000 mg | ORAL_TABLET | Freq: Every day | ORAL | 2 refills | Status: DC | PRN

## 2023-08-21 MED ORDER — ATORVASTATIN CALCIUM 20 MG PO TABS: 20.0000 mg | ORAL_TABLET | Freq: Every day | ORAL | 3 refills | Status: DC

## 2023-08-21 MED ORDER — OMEPRAZOLE 20 MG PO CPDR: 20.0000 mg | DELAYED_RELEASE_CAPSULE | Freq: Every day | ORAL | 3 refills | Status: AC

## 2023-08-21 MED ORDER — FINASTERIDE 5 MG PO TABS: 5.0000 mg | ORAL_TABLET | Freq: Every day | ORAL | 3 refills | Status: AC

## 2023-08-21 NOTE — Progress Notes (Signed)
BP 128/83   Pulse (!) 110   Ht 5\' 11"  (1.803 m)   Wt 187 lb (84.8 kg)   SpO2 91%   BMI 26.08 kg/m    Subjective:   Patient ID: Christopher Mata, male    DOB: 12-Oct-1945, 77 y.o.   MRN: [id number]  HPI: Christopher Mata is a 77 y.o. male presenting on 08/21/2023 for Medical Management of Chronic Issues  Hypertension Patient is not currently taking any medications. His blood pressure today is 128/83. He denies lightheadedness or dizziness, headaches, vision changes, chest pain, or shortness of breath.    Prediabetes Patient comes in today for recheck of his prediabetes. Patient is not currently taking any medications as his prediabetes is diet controlled. Patient has not seen an ophthalmologist this year. Patient denies any new issues with their feet.   Hyperlipidemia Patient is currently taking atorvastatin. He denies myalgias or weakness. He does not have a history of liver damage from it.   BPH/Erectile dysfunction Patient is currently taking finasteride. He is unsure whether this medication helps with his symptoms. He wakes up around once per night to urinate. He has difficulty initiating urine stream but otherwise denies dysuria or hematuria.  GERD Patient is currently taking omeprazole. He does not experience any indigestion as long as he takes this medication. He denies any abdominal pain, dysphagia, or belching.   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 He is currently taking pirfenidone, which has improved his symptoms. He uses supplemental oxygen.  Relevant past medical, surgical, family and social history reviewed and updated as indicated. Interim medical history since our last visit reviewed. Allergies and medications reviewed and updated.  Review of Systems  Constitutional:  Negative for chills and fever.  HENT:  Negative for congestion, rhinorrhea and trouble swallowing.   Eyes:  Negative for visual disturbance.  Respiratory:  Positive  for shortness of breath. Negative for chest tightness.        Shortness of breath with exertion, uses supplemental oxygen  Cardiovascular:  Negative for chest pain, palpitations and leg swelling.  Gastrointestinal:  Negative for abdominal pain, constipation and diarrhea.  Genitourinary:  Positive for difficulty urinating and enuresis. Negative for decreased urine volume, dysuria, frequency and hematuria.  Musculoskeletal:  Negative for myalgias.  Neurological:  Negative for dizziness, syncope, weakness, light-headedness and headaches.    Per HPI unless specifically indicated above   Allergies as of 08/21/2023   No Known Allergies      Medication List        Accurate as of August 21, 2023  3:55 PM. If you have any questions, ask your nurse or doctor.          STOP taking these medications    azithromycin 250 MG tablet Commonly known as: ZITHROMAX Stopped by: Elige Radon Saidi Santacroce       TAKE these medications    atorvastatin 20 MG tablet Commonly known as: LIPITOR Take 1 tablet (20 mg total) by mouth at bedtime. What changed: additional instructions Changed by: Elige Radon Bethany Hirt   finasteride 5 MG tablet Commonly known as: PROSCAR Take 1 tablet (5 mg total) by mouth daily.   omeprazole 20 MG capsule Commonly known as: PRILOSEC Take 1 capsule (20 mg total) by mouth daily.   Pirfenidone 267 MG Tabs Take 3 tablets (801 mg total) by mouth in the morning, at noon, and at bedtime. What changed: Another medication with the same name was removed. Continue taking this medication, and follow the  directions you see here. Changed by: Elige Radon Marleen Moret         Objective:   BP 128/83   Pulse (!) 110   Ht 5\' 11"  (1.803 m)   Wt 187 lb (84.8 kg)   SpO2 91%   BMI 26.08 kg/m   Wt Readings from Last 3 Encounters:  08/21/23 187 lb (84.8 kg)  10/16/22 190 lb 3.2 oz (86.3 kg)  07/10/22 195 lb (88.5 kg)    Physical Exam Vitals and nursing note reviewed.   Constitutional:      General: He is not in acute distress.    Appearance: Normal appearance.  HENT:     Head: Normocephalic and atraumatic.     Right Ear: External ear normal.     Left Ear: External ear normal.  Eyes:     Conjunctiva/sclera: Conjunctivae normal.  Cardiovascular:     Rate and Rhythm: Normal rate and regular rhythm.     Heart sounds: Normal heart sounds.  Pulmonary:     Effort: Pulmonary effort is normal. No respiratory distress.     Breath sounds: Rales present.  Abdominal:     Tenderness: There is no right CVA tenderness or left CVA tenderness.  Musculoskeletal:     Cervical back: Normal range of motion and neck supple. No tenderness.     Right lower leg: No edema.     Left lower leg: No edema.  Skin:    General: Skin is warm and dry.  Neurological:     Mental Status: He is alert and oriented to person, place, and time.  Psychiatric:        Mood and Affect: Mood normal.        Behavior: Behavior normal.        Thought Content: Thought content normal.        Judgment: Judgment normal.    Assessment & Plan:   Problem List Items Addressed This Visit       Cardiovascular and Mediastinum   Hypertension - Primary   Relevant Medications   atorvastatin (LIPITOR) 20 MG tablet   Other Relevant Orders   CBC with Differential/Platelet   CMP14+EGFR     Digestive   GERD (gastroesophageal reflux disease)   Relevant Medications   omeprazole (PRILOSEC) 20 MG capsule   Other Relevant Orders   CBC with Differential/Platelet     Other   Prediabetes   Relevant Orders   CBC with Differential/Platelet   CMP14+EGFR   Bayer DCA Hb A1c Waived   Hyperlipidemia LDL goal <130   Relevant Medications   atorvastatin (LIPITOR) 20 MG tablet   Other Relevant Orders   CBC with Differential/Platelet   Lipid panel   Benign prostatic hyperplasia with nocturia   Relevant Medications   finasteride (PROSCAR) 5 MG tablet   Other Relevant Orders   PSA, total and free     Patient is doing well. Blood pressure well-controlled without medications at 128/83. Prediabetes is diet-controlled. Will check HgbA1c today. Patient tolerating statin well. Will also check lipid panel today. Will start sildenafil for erectile dysfunction. Otherwise no medication changes.  Follow up plan: Return in about 6 months (around 02/18/2024), or if symptoms worsen or fail to improve, for Hypertension and prediabetes and hyperlipidemia.  Counseling provided for all of the vaccine components Orders Placed This Encounter  Procedures   CBC with Differential/Platelet   CMP14+EGFR   Lipid panel   Bayer DCA Hb A1c Waived   PSA, total and free    Fleet Contras  Corine Shelter, Medical Student [facility] 08/21/2023, 3:55 PM  Patient seen and examined with Gillermina Phy, medical student, agree with assessment and plan above.  Looks like blood pressure and blood sugars are under control, no change in medication.  He did want a prescription for sildenafil to try it and we did not get that to him. .siggJD

## 2023-08-22 LAB — CBC WITH DIFFERENTIAL/PLATELET
Basophils Absolute: 0 10*3/uL (ref 0.0–0.2)
Basos: 1 %
EOS (ABSOLUTE): 0.1 10*3/uL (ref 0.0–0.4)
Eos: 2 %
Hematocrit: 49.3 % (ref 37.5–51.0)
Hemoglobin: 15.8 g/dL (ref 13.0–17.7)
Immature Grans (Abs): 0 10*3/uL (ref 0.0–0.1)
Immature Granulocytes: 0 %
Lymphocytes Absolute: 2.7 10*3/uL (ref 0.7–3.1)
Lymphs: 31 %
MCH: 29.8 pg (ref 26.6–33.0)
MCHC: 32 g/dL (ref 31.5–35.7)
MCV: 93 fL (ref 79–97)
Monocytes Absolute: 0.8 10*3/uL (ref 0.1–0.9)
Monocytes: 10 %
Neutrophils Absolute: 5.1 10*3/uL (ref 1.4–7.0)
Neutrophils: 56 %
Platelets: 235 10*3/uL (ref 150–450)
RBC: 5.3 x10E6/uL (ref 4.14–5.80)
RDW: 11.9 % (ref 11.6–15.4)
WBC: 8.9 10*3/uL (ref 3.4–10.8)

## 2023-08-22 LAB — CMP14+EGFR
ALT: 13 [IU]/L (ref 0–44)
AST: 16 [IU]/L (ref 0–40)
Albumin: 4 g/dL (ref 3.8–4.8)
Alkaline Phosphatase: 94 [IU]/L (ref 44–121)
BUN/Creatinine Ratio: 9 — ABNORMAL LOW (ref 10–24)
BUN: 10 mg/dL (ref 8–27)
Bilirubin Total: 0.3 mg/dL (ref 0.0–1.2)
CO2: 24 mmol/L (ref 20–29)
Calcium: 8.7 mg/dL (ref 8.6–10.2)
Chloride: 101 mmol/L (ref 96–106)
Creatinine, Ser: 1.09 mg/dL (ref 0.76–1.27)
Globulin, Total: 3.1 g/dL (ref 1.5–4.5)
Glucose: 96 mg/dL (ref 70–99)
Potassium: 3.9 mmol/L (ref 3.5–5.2)
Sodium: 139 mmol/L (ref 134–144)
Total Protein: 7.1 g/dL (ref 6.0–8.5)
eGFR: 70 mL/min/{1.73_m2} (ref >59)

## 2023-08-22 LAB — LIPID PANEL
Chol/HDL Ratio: 2.3 ratio (ref 0.0–5.0)
Cholesterol, Total: 134 mg/dL (ref 100–199)
HDL: 59 mg/dL (ref >39)
LDL Chol Calc (NIH): 51 mg/dL (ref 0–99)
Triglycerides: 138 mg/dL (ref 0–149)
VLDL Cholesterol Cal: 24 mg/dL (ref 5–40)

## 2023-08-22 LAB — PSA, TOTAL AND FREE
PSA, Free Pct: 33.3 %
PSA, Free: 0.1 ng/mL
Prostate Specific Ag, Serum: 0.3 ng/mL (ref 0.0–4.0)

## 2023-09-01 ENCOUNTER — Other Ambulatory Visit (HOSPITAL_COMMUNITY): Payer: Self-pay

## 2023-09-01 NOTE — Progress Notes (Signed)
Specialty Pharmacy Refill Coordination Note  Christopher Mata is a 77 y.o. male contacted today regarding refills of specialty medication(s) Pirfenidone   Patient requested Delivery   Delivery date: 09/03/23   Verified address: [address] [place] [address]   Medication will be filled on 09/02/23.

## 2023-09-02 ENCOUNTER — Other Ambulatory Visit: Payer: Self-pay

## 2023-09-18 ENCOUNTER — Other Ambulatory Visit (HOSPITAL_COMMUNITY): Payer: Self-pay

## 2023-09-24 ENCOUNTER — Other Ambulatory Visit: Payer: Self-pay

## 2023-09-29 ENCOUNTER — Other Ambulatory Visit: Payer: Self-pay

## 2023-09-29 NOTE — Progress Notes (Signed)
Specialty Pharmacy Refill Coordination Note  Christopher Mata is a 77 y.o. male contacted today regarding refills of specialty medication(s) Pirfenidone   Patient requested Delivery   Delivery date: 10/06/23   Verified address: [address] [place] [address]   Medication will be filled on 10/03/23.

## 2023-10-03 ENCOUNTER — Other Ambulatory Visit: Payer: Self-pay

## 2023-10-23 ENCOUNTER — Other Ambulatory Visit: Payer: Self-pay

## 2023-10-23 NOTE — Progress Notes (Signed)
Specialty Pharmacy Refill Coordination Note  Christopher Mata is a 78 y.o. male contacted today regarding refills of specialty medication(s) Pirfenidone   Patient requested Delivery   Delivery date: 11/03/23   Verified address: [address] [place] [address]   Medication will be filled on 01.24.25.

## 2023-10-31 ENCOUNTER — Other Ambulatory Visit: Payer: Self-pay

## 2023-11-24 ENCOUNTER — Other Ambulatory Visit (HOSPITAL_COMMUNITY): Payer: Self-pay

## 2023-12-08 ENCOUNTER — Other Ambulatory Visit (HOSPITAL_COMMUNITY): Payer: Self-pay

## 2023-12-08 NOTE — Progress Notes (Signed)
 Specialty Pharmacy Ongoing Clinical Assessment Note  Christopher Mata is a 78 y.o. male who is being followed by the specialty pharmacy service for RxSp Interstitial Lung Disease   Patient's specialty medication(s) reviewed today: Pirfenidone   Missed doses in the last 4 weeks: 0   Patient/Caregiver did not have any additional questions or concerns.   Therapeutic benefit summary: Patient is achieving benefit   Adverse events/side effects summary: No adverse events/side effects   Patient's therapy is appropriate to: Continue    Goals Addressed             This Visit's Progress    Maintain optimal adherence to therapy       Patient is on track. Patient will maintain adherence         Follow up:  6 months  Servando Snare Specialty Pharmacist

## 2023-12-08 NOTE — Progress Notes (Signed)
 Specialty Pharmacy Refill Coordination Note  Christopher Mata is a 78 y.o. male contacted today regarding refills of specialty medication(s) Pirfenidone   Patient requested Delivery   Delivery date: 12/11/23   Verified address: [address] [place]  [address]   Medication will be filled on 12/10/23.

## 2023-12-10 ENCOUNTER — Other Ambulatory Visit: Payer: Self-pay

## 2024-01-05 ENCOUNTER — Other Ambulatory Visit: Payer: Self-pay

## 2024-01-05 ENCOUNTER — Other Ambulatory Visit: Payer: Self-pay | Admitting: Pulmonary Disease

## 2024-01-05 ENCOUNTER — Other Ambulatory Visit: Payer: Self-pay | Admitting: Pharmacy Technician

## 2024-01-05 ENCOUNTER — Telehealth: Payer: Self-pay | Admitting: Pulmonary Disease

## 2024-01-05 DIAGNOSIS — J84112 Idiopathic pulmonary fibrosis: Secondary | ICD-10-CM

## 2024-01-05 NOTE — Progress Notes (Signed)
 Specialty Pharmacy Refill Coordination Note  Christopher Mata is a 78 y.o. male contacted today regarding refills of specialty medication(s) Pirfenidone   Patient requested Delivery   Delivery date: 01/14/24   Verified address: [address] [place]    Medication will be filled on 01/13/24.  RR sent to MD

## 2024-01-05 NOTE — Telephone Encounter (Signed)
 He was last seen in January 2024.  For further refills of pirfenidone he needs an office visit.  Please schedule with me today at 330 on with APP @ [facility]

## 2024-01-19 ENCOUNTER — Ambulatory Visit: Admitting: Primary Care

## 2024-01-19 ENCOUNTER — Telehealth: Payer: Self-pay

## 2024-01-19 NOTE — Telephone Encounter (Signed)
 Copied from CRM [id number]. Topic: Clinical - Medication Question >> Jan 19, 2024  3:18 PM Alverda Joe S wrote: Reason for CRM: patient wife calling because patient is out of his medication and would like a small prescription amount. Please call patient for further details.

## 2024-01-19 NOTE — Telephone Encounter (Signed)
 Based on chart review, since CRM does NOT indicate which medication patient is requesting, it seems he needs refills on Pirfenidone.   Patient was scheduled to see Irby Mannan, NP today, 01/19/24, however she had to leave office early due to emergency. OV had to be rescheduled, this is not fault of the patient.   Dr. Villa Greaser (TP since Villa Greaser is out of office) - please refill through rescheduled OV 03/17/24. Thank you!

## 2024-01-20 ENCOUNTER — Other Ambulatory Visit: Payer: Self-pay | Admitting: Pulmonary Disease

## 2024-01-20 ENCOUNTER — Other Ambulatory Visit: Payer: Self-pay | Admitting: Acute Care

## 2024-01-20 ENCOUNTER — Other Ambulatory Visit (HOSPITAL_BASED_OUTPATIENT_CLINIC_OR_DEPARTMENT_OTHER): Payer: Self-pay

## 2024-01-20 ENCOUNTER — Other Ambulatory Visit: Payer: Self-pay

## 2024-01-20 ENCOUNTER — Other Ambulatory Visit (HOSPITAL_COMMUNITY): Payer: Self-pay

## 2024-01-20 DIAGNOSIS — J84112 Idiopathic pulmonary fibrosis: Secondary | ICD-10-CM

## 2024-01-20 MED ORDER — PIRFENIDONE 267 MG PO TABS
801.0000 mg | ORAL_TABLET | Freq: Three times a day (TID) | ORAL | 2 refills | Status: DC
  Filled 2024-01-20 – 2024-01-21 (×2): qty 270, 30d supply, fill #0

## 2024-01-20 NOTE — Progress Notes (Signed)
 Reached out to [facility] on 04/15. Patient's appointment was cancelled by provider and was rescheduled for June. Patient will not have enough medication on hand to last until then. [facility] should be sending in new rx to last patient until June appointment. Will call patient once rx has been sent in. Call to update patient and adjust refill call.

## 2024-01-20 NOTE — Progress Notes (Signed)
 I have called the patient to confirm his dose of pirfenidone.  I am sending in a prescription with 2 refills to the [facility] health specialty pharmacy.  Patient verbalized that this gets mailed to his home. Patient was very upset his appointment got canceled yesterday.  He drove to [place] to find out his appointment had been canceled.  He has 6 days of pirfenidone left.  I have refilled the pirfenidone as the office cancellation was not his fault and I do not want a patient with a chronic illness to run out of his medication especially since we were not able to offer a follow-up appointment until March 17, 2024. Patient confirmed dose of 3 tablets 3 times a day. I confirmed pharmacy, prescription has been sent, I called and spoke with the pharmacist at the [facility] outpatient pharmacy who is going to track the medication to ensure it gets filled.

## 2024-01-20 NOTE — Telephone Encounter (Signed)
 Please advise,pt will be out of pirfenidone this week,had an appointment scheduled with Beth yesterday,Beth had to cancel,Alva is out it was sent to [facility] who is also out and has another appointment schedule and wants a refill on his medication to last him till his next appointment since the cancellation was not on him.

## 2024-01-20 NOTE — Telephone Encounter (Signed)
 Copied from CRM [id number]. Topic: Clinical - Medication Refill >> Jan 20, 2024 12:51 PM Hilton Lucky wrote: Most Recent Primary Care Visit:   Medication: Pirfenidone 267 MG TABS  Has the patient contacted their pharmacy? Pharmacy is contacting us .   Is this the correct pharmacy for this prescription? Yes If no, delete pharmacy and type the correct one.  This is the patient's preferred pharmacy:   [facility] [address] [place] [address] Phone: [phone] Fax: [phone]   Has the prescription been filled recently? No  Is the patient out of the medication? Yes  Has the patient been seen for an appointment in the last year OR does the patient have an upcoming appointment? Yes  Can we respond through MyChart? Yes  Agent: Please be advised that Rx refills may take up to 3 business days. We ask that you follow-up with your pharmacy.

## 2024-01-21 ENCOUNTER — Other Ambulatory Visit: Payer: Self-pay

## 2024-01-21 ENCOUNTER — Other Ambulatory Visit (HOSPITAL_COMMUNITY): Payer: Self-pay

## 2024-01-21 DIAGNOSIS — D485 Neoplasm of uncertain behavior of skin: Secondary | ICD-10-CM | POA: Diagnosis not present

## 2024-01-21 DIAGNOSIS — D225 Melanocytic nevi of trunk: Secondary | ICD-10-CM | POA: Diagnosis not present

## 2024-01-21 DIAGNOSIS — C4441 Basal cell carcinoma of skin of scalp and neck: Secondary | ICD-10-CM | POA: Diagnosis not present

## 2024-01-21 DIAGNOSIS — C44219 Basal cell carcinoma of skin of left ear and external auricular canal: Secondary | ICD-10-CM | POA: Diagnosis not present

## 2024-01-21 DIAGNOSIS — C44229 Squamous cell carcinoma of skin of left ear and external auricular canal: Secondary | ICD-10-CM | POA: Diagnosis not present

## 2024-01-22 ENCOUNTER — Telehealth: Payer: Self-pay | Admitting: Acute Care

## 2024-01-22 NOTE — Telephone Encounter (Signed)
 This patient needs to be scheduled for sooner than

## 2024-01-22 NOTE — Telephone Encounter (Signed)
 Been rescheduled for 4/22 with Tammy Parrett

## 2024-01-22 NOTE — Telephone Encounter (Signed)
 I called the patient and explained that he has not had LFT's since 08/21/2023. I explained therapeutic drug monitoring for pirfenidone is once every 3 months.  There is a [facility] in [place] that is near his home. I asked if he would go to the [facility] lab to get LFT's done before he sees Roena Clark NP , so that she can review results  with him at the time of his appointment on 01/27/2024. He said that would not be convenient for him, and preferred to have the labs drawn at the time of his appointment 01/27/2024. This is the patient's preference.

## 2024-01-27 ENCOUNTER — Ambulatory Visit (INDEPENDENT_AMBULATORY_CARE_PROVIDER_SITE_OTHER): Admitting: Adult Health

## 2024-01-27 ENCOUNTER — Other Ambulatory Visit: Payer: Self-pay

## 2024-01-27 ENCOUNTER — Ambulatory Visit

## 2024-01-27 ENCOUNTER — Encounter: Payer: Self-pay | Admitting: Adult Health

## 2024-01-27 VITALS — BP 106/74 | Temp 92.0°F | Ht 70.0 in | Wt 189.0 lb

## 2024-01-27 DIAGNOSIS — J9611 Chronic respiratory failure with hypoxia: Secondary | ICD-10-CM

## 2024-01-27 DIAGNOSIS — J84112 Idiopathic pulmonary fibrosis: Secondary | ICD-10-CM

## 2024-01-27 DIAGNOSIS — J8410 Pulmonary fibrosis, unspecified: Secondary | ICD-10-CM | POA: Diagnosis not present

## 2024-01-27 DIAGNOSIS — J984 Other disorders of lung: Secondary | ICD-10-CM | POA: Diagnosis not present

## 2024-01-27 LAB — HEPATIC FUNCTION PANEL
ALT: 12 U/L (ref 0–53)
AST: 18 U/L (ref 0–37)
Albumin: 4.2 g/dL (ref 3.5–5.2)
Alkaline Phosphatase: 66 U/L (ref 39–117)
Bilirubin, Direct: 0.1 mg/dL (ref 0.0–0.3)
Total Bilirubin: 0.5 mg/dL (ref 0.2–1.2)
Total Protein: 7.6 g/dL (ref 6.0–8.3)

## 2024-01-27 MED ORDER — PIRFENIDONE 267 MG PO TABS
801.0000 mg | ORAL_TABLET | Freq: Three times a day (TID) | ORAL | 2 refills | Status: DC
  Filled 2024-01-27 – 2024-02-13 (×2): qty 270, 30d supply, fill #0
  Filled 2024-03-17: qty 270, 30d supply, fill #1
  Filled 2024-04-22: qty 270, 30d supply, fill #2

## 2024-01-27 NOTE — Patient Instructions (Addendum)
 Continue on Esbriet  3 capsules Three times a day.  Activity as tolerated Check Chest xray and labs today  Continue on Oxygen  2l/m  Follow up with Dr. Villa Greaser  at [facility] office in 6 months with PFT -Spirometry with DLCO

## 2024-01-27 NOTE — Assessment & Plan Note (Signed)
 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  Most recent CT chest 2023 showed stable changes.  Patient needs a follow-up PFTs with spirometry with DLCO on return visit pending these results can decide on timing of next CT scan.  Chest x-ray today.  Continue on Esbriet .  Is tolerating well.  Discussed with patient in detail on routine follow-up and routine labs.  Most recent LFTs in November 2024 were normal Chest x-ray and LFTs are pending today.  Plan  Patient Instructions  Continue on Esbriet  3 capsules Three times a day.  Activity as tolerated Check Chest xray and labs today  Continue on Oxygen  2l/m  Follow up with Dr. Villa Greaser  at [facility] office in 6 months with PFT -Spirometry with DLCO

## 2024-01-27 NOTE — Progress Notes (Signed)
 @Patient  ID: Christopher Mata, male    DOB: [birth year], 78 y.o.   MRN: [id number]  Chief Complaint  Patient presents with   Follow-up    Referring provider: Dettinger, Lucio Sabin, MD  HPI: 78 yo male former smoker followed for IPF and Chronic Respiratory Failure  Started on OFEV 06/2020 -Unable to tolerate due to diarrhea . 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 Findings are consistent with UIP    CT angiogram 03/2015 minimal basal scarring   PFTs 10/2021 ratio 78, FEV1 67%, FVC 62%/2.65, improved to 2.86 , TLC 64%, DLCO 9.42/37%, dropped from 13.3   PFTs 05/2020 moderate to severe restriction, ratio 76, FEV1 57%, FVC 55%, TLC 62%, DLCO 52%, no bronchodilator response  Autoimmune panel September 2021 negative-ENA, scleroderma, Sjogren's, anti-Jo, double-stranded DNA, ANA   01/27/2024 Follow up: IPF and O2 RF  Patient returns for follow-up visit.  Patient was last seen January 2024.  He is followed for IPF with UIP changes on CT imaging.  Previously started on Ofev in 2021 but was unable to tolerate with significant GI side effects.  Started on Esbriet  in January 2024.  Patient says that he has done very well on Esbriet .  Had tolerated well with no nausea vomiting or diarrhea.  Says that he does feel like his breathing has been stable over this past year.  He is taking Esbriet  3 capsules 3 times daily.  Most recent CT chest was March 2023 with stable UIP pattern.  Patient is on  oxygen  2 L.  Says that typically if he is sitting down or watching TV he can take his oxygen  off and keep O2 saturations greater than 92%.  He says he does get short of breath with walking but has not had any change in activity tolerance over this past year.  He is able to still take the trash out.  He lives at home with his wife.  He is able to drive short distances.   He denies any flare of cough.  No fever or discolored mucus no hemoptysis.        No Known Allergies  Immunization History  Administered Date(s) Administered   Fluad Quad(high Dose 65+) 07/23/2021   Influenza, High Dose Seasonal PF 08/02/2020   Influenza-Unspecified 09/22/2018   Moderna Sars-Covid-2 Vaccination 11/12/2019, 12/09/2019, 08/02/2020   Pneumococcal Conjugate-13 08/17/2011   Pneumococcal Polysaccharide-23 04/17/2018    Past Medical History:  Diagnosis Date   Allergy to alpha-gal    GERD (gastroesophageal reflux disease)    History of hiatal hernia    History of nocturia    Hyperlipidemia    Hypertension    Obesity (BMI 30-39.9)    Paraesophageal hiatal hernia s/p robitc repair/fundoplication 03/18/2015 03/18/2015   Seasonal allergies    Shortness of breath dyspnea    with exertion   Vertigo     Tobacco History: Social History   Tobacco Use  Smoking Status Former   Current packs/day: 0.00   Average packs/day: 1 pack/day for 20.0 years (20.0 ttl  pk-yrs)   Types: Cigarettes   Start date: 12/06/1966   Quit date: 12/06/1986   Years since quitting: 37.1  Smokeless Tobacco Current   Types: Snuff   Ready to quit: Not Answered Counseling given: Not Answered   Outpatient Medications Prior to Visit  Medication Sig Dispense Refill   atorvastatin  (LIPITOR) 20 MG tablet Take 1 tablet (20 mg total) by mouth at bedtime. 90 tablet 3   finasteride  (PROSCAR ) 5 MG tablet Take 1 tablet (5 mg total) by mouth daily. 90 tablet 3   omeprazole  (PRILOSEC) 20 MG capsule Take 1 capsule (20 mg total) by mouth daily.  90 capsule 3   Pirfenidone  267 MG TABS Take 3 tablets (801 mg total) by mouth in the morning, at noon, and at bedtime. 270 tablet 2   sildenafil  (VIAGRA ) 100 MG tablet Take 1 tablet (100 mg total) by mouth daily as needed for erectile dysfunction. (Patient not taking: Reported on 01/27/2024) 30 tablet 2   No facility-administered medications prior to visit.     Review of Systems:   Constitutional:   No  weight loss, night sweats,  Fevers, chills, +fatigue, or  lassitude.  HEENT:   No headaches,  Difficulty swallowing,  Tooth/dental problems, or  Sore throat,                No sneezing, itching, ear ache, nasal congestion, post nasal drip,   CV:  No chest pain,  Orthopnea, PND, swelling in lower extremities, anasarca, dizziness, palpitations, syncope.   GI  No heartburn, indigestion, abdominal pain, nausea, vomiting, diarrhea, change in bowel habits, loss of appetite, bloody stools.   Resp:   No chest wall deformity  Skin: no rash or lesions.  GU: no dysuria, change in color of urine, no urgency or frequency.  No flank pain, no hematuria   MS:  No joint pain or swelling.  No decreased range of motion.  No back pain.    Physical Exam  BP 106/74   Temp (!) 92 F (33.3 C)   Ht 5\' 10"  (1.778 m)   Wt 189 lb (85.7 kg)   SpO2 92%   BMI 27.12 kg/m   GEN: A/Ox3; pleasant , NAD, well nourished   HEENT:  [place]/AT,  EACs-clear, TMs-wnl, NOSE-clear, THROAT-clear, no lesions, no postnasal drip or exudate noted.   NECK:  Supple w/ fair ROM; no JVD; normal carotid impulses w/o bruits; no thyromegaly or nodules palpated; no lymphadenopathy.    RESP bibasilar crackles  no accessory muscle use, no dullness to percussion  CARD:  RRR, no m/r/g, no peripheral edema, pulses intact, no cyanosis or clubbing.  GI:   Soft & nt; nml bowel sounds; no organomegaly or masses detected.   Musco: Warm bil, no deformities or joint swelling noted.   Neuro: alert, no focal deficits noted.    Skin:  Warm, no lesions or rashes    Lab Results:     BNP   ProBNP No results found for: "PROBNP"  Imaging: No results found.  Administration History     None          Latest Ref Rng & Units 10/23/2021    1:55 PM 06/02/2020    3:37 PM  PFT Results  FVC-Pre L 2.65  2.40   FVC-Predicted Pre % 62  55   FVC-Post L 2.86  2.46   FVC-Predicted Post % 67  57   Pre FEV1/FVC % % 78  76   Post FEV1/FCV % % 78  80   FEV1-Pre L 2.07  1.82   FEV1-Predicted Pre % 67  57   FEV1-Post L 2.22  1.96   DLCO uncorrected ml/min/mmHg 9.42  13.38   DLCO UNC% % 37  52   DLCO corrected ml/min/mmHg  13.24   DLCO COR %Predicted %  52   DLVA Predicted % 61  90   TLC L 4.57  4.36   TLC % Predicted % 64  62   RV % Predicted % 74  77     No results found for: "NITRICOXIDE"      Assessment & Plan:   IPF (idiopathic pulmonary fibrosis) (HCC) Idiopathic pulmonary fibrosis with UIP changes on serial CT imaging.  Most recent CT chest 2023 showed stable changes.  Patient needs a follow-up PFTs with spirometry with DLCO on return visit pending these results can decide on timing of next CT scan.  Chest x-ray today.  Continue on Esbriet .  Is tolerating well.  Discussed with patient in detail on routine follow-up and routine labs.  Most recent LFTs in November 2024 were normal Chest x-ray and LFTs are pending today.  Plan  Patient Instructions  Continue on Esbriet  3 capsules Three times a day.  Activity as tolerated Check Chest xray and labs today  Continue on Oxygen  2l/m  Follow up with Dr. Villa Greaser  at [facility] office in 6 months with PFT -Spirometry with DLCO      Chronic respiratory failure with hypoxia (HCC) Continue on oxygen  to maintain O2 saturations greater than 88 to 90%.     Roena Clark, NP 01/27/2024

## 2024-01-27 NOTE — Assessment & Plan Note (Signed)
Continue on oxygen to maintain O2 saturations greater than 88 to 90%. 

## 2024-01-28 ENCOUNTER — Other Ambulatory Visit: Payer: Self-pay

## 2024-02-09 DIAGNOSIS — D485 Neoplasm of uncertain behavior of skin: Secondary | ICD-10-CM | POA: Diagnosis not present

## 2024-02-09 DIAGNOSIS — L988 Other specified disorders of the skin and subcutaneous tissue: Secondary | ICD-10-CM | POA: Diagnosis not present

## 2024-02-11 ENCOUNTER — Other Ambulatory Visit: Payer: Self-pay

## 2024-02-13 ENCOUNTER — Other Ambulatory Visit: Payer: Self-pay

## 2024-02-13 NOTE — Progress Notes (Signed)
 Specialty Pharmacy Refill Coordination Note  Christopher Mata is a 78 y.o. male contacted today regarding refills of specialty medication(s) Pirfenidone    Patient requested Delivery   Delivery date: 02/17/24   Verified address: [address]   [place] [id number]   Medication will be filled on 02/16/24.

## 2024-02-16 ENCOUNTER — Other Ambulatory Visit: Payer: Self-pay

## 2024-03-17 ENCOUNTER — Ambulatory Visit: Admitting: Primary Care

## 2024-03-17 ENCOUNTER — Other Ambulatory Visit: Payer: Self-pay

## 2024-03-17 NOTE — Progress Notes (Signed)
 Specialty Pharmacy Refill Coordination Note  Christopher Mata is a 78 y.o. male contacted today regarding refills of specialty medication(s) Pirfenidone    Patient requested Delivery   Delivery date: 03/22/24   Verified address: [address]   [place] [address]   Medication will be filled on 03/19/24.

## 2024-03-18 ENCOUNTER — Other Ambulatory Visit: Payer: Self-pay

## 2024-03-18 ENCOUNTER — Other Ambulatory Visit (HOSPITAL_COMMUNITY): Payer: Self-pay

## 2024-04-20 ENCOUNTER — Other Ambulatory Visit (HOSPITAL_COMMUNITY): Payer: Self-pay

## 2024-04-22 ENCOUNTER — Other Ambulatory Visit: Payer: Self-pay

## 2024-04-22 NOTE — Progress Notes (Signed)
 Specialty Pharmacy Refill Coordination Note  Christopher Mata is a 78 y.o. male contacted today regarding refills of specialty medication(s) Pirfenidone    Patient requested Delivery   Delivery date: 04/30/24   Verified address: [address]   [place]  [id number]   Medication will be filled on 04/29/24.

## 2024-04-29 ENCOUNTER — Other Ambulatory Visit: Payer: Self-pay

## 2024-05-21 ENCOUNTER — Other Ambulatory Visit: Payer: Self-pay | Admitting: Adult Health

## 2024-05-21 ENCOUNTER — Other Ambulatory Visit: Payer: Self-pay

## 2024-05-21 DIAGNOSIS — J84112 Idiopathic pulmonary fibrosis: Secondary | ICD-10-CM

## 2024-05-31 ENCOUNTER — Other Ambulatory Visit: Payer: Self-pay

## 2024-05-31 ENCOUNTER — Other Ambulatory Visit: Payer: Self-pay | Admitting: Adult Health

## 2024-05-31 DIAGNOSIS — J84112 Idiopathic pulmonary fibrosis: Secondary | ICD-10-CM

## 2024-05-31 MED ORDER — PIRFENIDONE 267 MG PO TABS
801.0000 mg | ORAL_TABLET | Freq: Three times a day (TID) | ORAL | 2 refills | Status: DC
  Filled 2024-05-31: qty 270, 30d supply, fill #0
  Filled 2024-06-29: qty 270, 30d supply, fill #1
  Filled 2024-07-27: qty 270, 30d supply, fill #2

## 2024-05-31 NOTE — Progress Notes (Signed)
 Specialty Pharmacy Refill Coordination Note  Christopher Mata is a 79 y.o. male contacted today regarding refills of specialty medication(s) Pirfenidone    Patient requested Delivery   Delivery date: 06/03/24   Verified address: [address]   [place] [id number]   Medication will be filled on 06/02/24. This fill date is pending response to refill request from provider. Patient is aware and if they have not received fill by intended date they must follow up with pharmacy.

## 2024-06-01 ENCOUNTER — Other Ambulatory Visit: Payer: Self-pay

## 2024-06-29 ENCOUNTER — Other Ambulatory Visit: Payer: Self-pay

## 2024-06-29 NOTE — Progress Notes (Signed)
 Specialty Pharmacy Refill Coordination Note  Christopher Mata is a 78 y.o. male contacted today regarding refills of specialty medication(s) Pirfenidone    Patient requested Delivery   Delivery date: 07/02/24   Verified address: [address]   [place] [phone]   Medication will be filled on 09.25.25.

## 2024-06-30 ENCOUNTER — Other Ambulatory Visit: Payer: Self-pay

## 2024-07-27 ENCOUNTER — Other Ambulatory Visit: Payer: Self-pay

## 2024-07-28 ENCOUNTER — Other Ambulatory Visit: Payer: Self-pay

## 2024-07-28 NOTE — Progress Notes (Signed)
 Specialty Pharmacy Refill Coordination Note  Christopher Mata is a 78 y.o. male contacted today regarding refills of specialty medication(s) Pirfenidone    Patient requested Delivery   Delivery date: 08/04/24   Verified address: [address]   [place] [id number]   Medication will be filled on 08/03/24.

## 2024-08-03 ENCOUNTER — Other Ambulatory Visit: Payer: Self-pay

## 2024-08-04 ENCOUNTER — Other Ambulatory Visit: Payer: Self-pay

## 2024-08-04 ENCOUNTER — Telehealth: Payer: Self-pay

## 2024-08-04 DIAGNOSIS — J84112 Idiopathic pulmonary fibrosis: Secondary | ICD-10-CM

## 2024-08-04 DIAGNOSIS — J9611 Chronic respiratory failure with hypoxia: Secondary | ICD-10-CM

## 2024-08-04 NOTE — Telephone Encounter (Signed)
 Patient scheduled for Full PFT at [facility] on 11/3 at 3:00 P.M.

## 2024-08-04 NOTE — Telephone Encounter (Signed)
 Copied from CRM #[id number]. Topic: Appointments - Scheduling Inquiry for Clinic >> Aug 03, 2024 11:08 AM Christopher Mata wrote: Reason for CRM: patient called in to attempt to schedule PFT but it was stating it expired on 07/28/24 and I was not able to schedule it for him - he is requesting to have referral re opened to schedule at [facility] (preferred) or [facility] his ph# [phone]  I order the pft at [facility] (routing to [facility] admin) just needs to be scheduled - but it looks like pt needs to be set up for appt with dr jude for 6 months (routing to [facility] [employer] [employer])

## 2024-08-09 ENCOUNTER — Ambulatory Visit (INDEPENDENT_AMBULATORY_CARE_PROVIDER_SITE_OTHER)

## 2024-08-09 DIAGNOSIS — J84112 Idiopathic pulmonary fibrosis: Secondary | ICD-10-CM

## 2024-08-09 DIAGNOSIS — J9611 Chronic respiratory failure with hypoxia: Secondary | ICD-10-CM

## 2024-08-09 LAB — PULMONARY FUNCTION TEST
DL/VA % pred: 87 %
DL/VA: 3.46 ml/min/mmHg/L
DLCO unc % pred: 45 %
DLCO unc: 11.39 ml/min/mmHg
FEF 25-75 Post: 1.14 L/s
FEF 25-75 Pre: 1.73 L/s
FEF2575-%Change-Post: -33 %
FEF2575-%Pred-Post: 53 %
FEF2575-%Pred-Pre: 81 %
FEV1-%Change-Post: -11 %
FEV1-%Pred-Post: 57 %
FEV1-%Pred-Pre: 65 %
FEV1-Post: 1.73 L
FEV1-Pre: 1.96 L
FEV1FVC-%Change-Post: -13 %
FEV1FVC-%Pred-Pre: 110 %
FEV6-%Change-Post: 0 %
FEV6-%Pred-Post: 63 %
FEV6-%Pred-Pre: 63 %
FEV6-Post: 2.47 L
FEV6-Pre: 2.45 L
FEV6FVC-%Pred-Post: 106 %
FEV6FVC-%Pred-Pre: 106 %
FVC-%Change-Post: 1 %
FVC-%Pred-Post: 60 %
FVC-%Pred-Pre: 59 %
FVC-Post: 2.5 L
FVC-Pre: 2.45 L
Post FEV1/FVC ratio: 69 %
Post FEV6/FVC ratio: 100 %
Pre FEV1/FVC ratio: 80 %
Pre FEV6/FVC Ratio: 100 %
RV % pred: 66 %
RV: 1.72 L
TLC % pred: 58 %
TLC: 4.12 L

## 2024-08-09 NOTE — Patient Instructions (Signed)
 Full PFT performed today.

## 2024-08-09 NOTE — Progress Notes (Signed)
 Full PFT performed today.

## 2024-08-10 ENCOUNTER — Ambulatory Visit: Payer: Self-pay | Admitting: Adult Health

## 2024-08-10 NOTE — Progress Notes (Signed)
 ATC, VM box not set up so unable to LVM.

## 2024-08-11 NOTE — Progress Notes (Signed)
 ATCx2 unable to LVM. Sending MyChart message per protocol as pt has been unable to be reached.

## 2024-08-12 ENCOUNTER — Other Ambulatory Visit: Payer: Self-pay | Admitting: Family Medicine

## 2024-08-12 DIAGNOSIS — E785 Hyperlipidemia, unspecified: Secondary | ICD-10-CM

## 2024-08-12 NOTE — Telephone Encounter (Signed)
 Copied from CRM #[id number]. Topic: Clinical - Lab/Test Results >> Aug 12, 2024 12:23 PM Benton O wrote: Reason for CRM: patient returning call he received about his breathing results . Called cal no answer. Please give patient a call concerning his results  [phone]  Got patient scheduled for January 12th with dr jude for his follow up visit for the pft results . Read to patient the message that was in my chart .  He is ok with everything     Called pt and confirmed he had received PFT results. Pt states he did receive results. NFN

## 2024-08-26 ENCOUNTER — Other Ambulatory Visit (HOSPITAL_COMMUNITY): Payer: Self-pay

## 2024-08-26 ENCOUNTER — Other Ambulatory Visit: Payer: Self-pay | Admitting: Adult Health

## 2024-08-26 DIAGNOSIS — J84112 Idiopathic pulmonary fibrosis: Secondary | ICD-10-CM

## 2024-08-26 DIAGNOSIS — Z5181 Encounter for therapeutic drug level monitoring: Secondary | ICD-10-CM

## 2024-08-26 NOTE — Telephone Encounter (Signed)
 Pt requesting refill of specialty medication - routing to Rx team to advise.

## 2024-08-27 ENCOUNTER — Other Ambulatory Visit (HOSPITAL_COMMUNITY): Payer: Self-pay

## 2024-08-27 ENCOUNTER — Other Ambulatory Visit: Payer: Self-pay

## 2024-08-27 MED ORDER — PIRFENIDONE 267 MG PO TABS
801.0000 mg | ORAL_TABLET | Freq: Three times a day (TID) | ORAL | 2 refills | Status: DC
  Filled 2024-08-27 – 2024-09-13 (×3): qty 270, 30d supply, fill #0
  Filled 2024-10-08: qty 270, 30d supply, fill #1

## 2024-08-27 NOTE — Telephone Encounter (Signed)
 Refill sent for ESBRIET  to [facility]: [phone]   Dose: 801mg  by mouth three times daily  Last OV: 01/27/24 Provider: Dr. Jude Pertinent labs: LFTs wnl 01/27/24  Next OV: 10/18/24  LFTs are due. MyChart message sent. Order placed to obtain at [facility].  Aleck Puls, PharmD, BCPS Clinical Pharmacist  [facility]

## 2024-08-30 ENCOUNTER — Other Ambulatory Visit: Payer: Self-pay

## 2024-09-01 ENCOUNTER — Ambulatory Visit: Admitting: Family Medicine

## 2024-09-01 ENCOUNTER — Encounter: Payer: Self-pay | Admitting: Family Medicine

## 2024-09-01 ENCOUNTER — Other Ambulatory Visit: Payer: Self-pay

## 2024-09-01 VITALS — BP 116/81 | HR 107 | Ht 70.0 in | Wt 187.0 lb

## 2024-09-01 DIAGNOSIS — E785 Hyperlipidemia, unspecified: Secondary | ICD-10-CM | POA: Diagnosis not present

## 2024-09-01 DIAGNOSIS — I10 Essential (primary) hypertension: Secondary | ICD-10-CM | POA: Diagnosis not present

## 2024-09-01 DIAGNOSIS — R7303 Prediabetes: Secondary | ICD-10-CM | POA: Diagnosis not present

## 2024-09-01 DIAGNOSIS — N401 Enlarged prostate with lower urinary tract symptoms: Secondary | ICD-10-CM

## 2024-09-01 DIAGNOSIS — J84112 Idiopathic pulmonary fibrosis: Secondary | ICD-10-CM | POA: Diagnosis not present

## 2024-09-01 DIAGNOSIS — R351 Nocturia: Secondary | ICD-10-CM

## 2024-09-01 LAB — LIPID PANEL

## 2024-09-01 LAB — BAYER DCA HB A1C WAIVED: HB A1C (BAYER DCA - WAIVED): 5.5 % (ref 4.8–5.6)

## 2024-09-01 NOTE — Progress Notes (Signed)
 BP 116/81   Pulse (!) 107   Ht 5' 10 (1.778 m)   Wt 187 lb (84.8 kg)   SpO2 98%   BMI 26.83 kg/m    Subjective:   Patient ID: Christopher Mata, male    DOB: 1946-03-01, 78 y.o.   MRN: [id number]  HPI: Christopher Mata is a 78 y.o. male presenting on 09/01/2024 for Medical Management of Chronic Issues, Hypertension, Prediabetes, and COPD   Discussed the use of AI scribe software for clinical note transcription with the patient, who gave verbal consent to proceed.  History of Present Illness   Christopher Mata is a 78 year old male with pulmonary fibrosis who presents for a recheck.  Dyspnea and pulmonary fibrosis - Pulmonary fibrosis managed by pulmonologist - Recent pulmonary function test performed two weeks ago - Requires supplemental oxygen  at 2 liters, especially with ambulation or walking uphill - Experiences fatigue related to transporting oxygen  equipment  Hyperlipidemia - On medication for hyperlipidemia - Condition is well-controlled  Gastroesophageal reflux disease (gerd) - On long-term medication for GERD - No significant issues with heartburn for several years  Lower urinary tract symptoms - On medication for benign prostatic hyperplasia (BPH) - Variable urinary flow, with some days better than others - Previously trialed Flomax  but was unable to tolerate it - Nocturia once per night, which is manageable          Relevant past medical, surgical, family and social history reviewed and updated as indicated. Interim medical history since our last visit reviewed. Allergies and medications reviewed and updated.  Review of Systems  Constitutional:  Negative for chills and fever.  HENT:  Positive for congestion.   Eyes:  Negative for discharge.  Respiratory:  Positive for cough and shortness of breath. Negative for wheezing.   Cardiovascular:  Negative for chest pain and leg swelling.  Musculoskeletal:  Negative for back pain and gait problem.   Skin:  Negative for rash.  All other systems reviewed and are negative.   Per HPI unless specifically indicated above   Allergies as of 09/01/2024   No Known Allergies      Medication List        Accurate as of September 01, 2024 11:24 AM. If you have any questions, ask your nurse or doctor.          atorvastatin  20 MG tablet Commonly known as: LIPITOR TAKE 1 TABLET BY MOUTH AT BEDTIME   finasteride  5 MG tablet Commonly known as: PROSCAR  Take 1 tablet (5 mg total) by mouth daily.   omeprazole  20 MG capsule Commonly known as: PRILOSEC Take 1 capsule (20 mg total) by mouth daily.   Pirfenidone  267 MG Tabs Take 3 tablets (801 mg total) by mouth in the morning, at noon, and at bedtime.         Objective:   BP 116/81   Pulse (!) 107   Ht 5' 10 (1.778 m)   Wt 187 lb (84.8 kg)   SpO2 98%   BMI 26.83 kg/m   Wt Readings from Last 3 Encounters:  09/01/24 187 lb (84.8 kg)  01/27/24 189 lb (85.7 kg)  08/21/23 187 lb (84.8 kg)    Physical Exam Vitals and nursing note reviewed.  Constitutional:      Appearance: Normal appearance.  Neurological:     Mental Status: He is alert.    Physical Exam   CHEST: Lungs clear to auscultation. CARDIOVASCULAR: Regular heart sounds.  Assessment & Plan:   Problem List Items Addressed This Visit       Cardiovascular and Mediastinum   Hypertension - Primary   Relevant Orders   CBC with Differential/Platelet   CMP14+EGFR   Lipid panel   Bayer DCA Hb A1c Waived     Respiratory   IPF (idiopathic pulmonary fibrosis) (HCC)     Other   Prediabetes   Relevant Orders   CBC with Differential/Platelet   CMP14+EGFR   Lipid panel   Bayer DCA Hb A1c Waived   Hyperlipidemia LDL goal <130   Relevant Orders   CBC with Differential/Platelet   CMP14+EGFR   Lipid panel   Bayer DCA Hb A1c Waived   Benign prostatic hyperplasia with nocturia       Idiopathic pulmonary fibrosis Well-managed with no  progression. Oxygen  therapy effective at rest, increased exertion needed during activities. Current medication effective in slowing progression. - Continue current medication regimen for pulmonary fibrosis. - Continue oxygen  therapy at 2 liters as needed.  Benign prostatic hyperplasia with lower urinary tract symptoms Variable urinary flow. Flomax  not well-tolerated. Symptoms not severe enough for surgery. Nocturia once per night. - Continue current management for benign prostatic hyperplasia. - Monitor symptoms, consider surgery if symptoms worsen.  Hyperlipidemia Managed with current medication regimen. - Continue current medication regimen for hyperlipidemia.  Gastroesophageal reflux disease Well-controlled with current medication regimen. - Continue current medication regimen for gastroesophageal reflux disease.          Follow up plan: Return in about 6 months (around 03/01/2025), or if symptoms worsen or fail to improve, for prediabetes and htn and hld.  Counseling provided for all of the vaccine components Orders Placed This Encounter  Procedures   CBC with Differential/Platelet   CMP14+EGFR   Lipid panel   Bayer DCA Hb A1c Waived    Fonda Levins, MD [facility] 09/01/2024, 11:24 AM

## 2024-09-02 LAB — LIPID PANEL
Cholesterol, Total: 137 mg/dL (ref 100–199)
HDL: 59 mg/dL (ref >39)
LDL CALC COMMENT:: 2.3 ratio (ref 0.0–5.0)
LDL Chol Calc (NIH): 61 mg/dL (ref 0–99)
Triglycerides: 89 mg/dL (ref 0–149)
VLDL Cholesterol Cal: 17 mg/dL (ref 5–40)

## 2024-09-02 LAB — CBC WITH DIFFERENTIAL/PLATELET
Basophils Absolute: 0 x10E3/uL (ref 0.0–0.2)
Basos: 0 %
EOS (ABSOLUTE): 0.1 x10E3/uL (ref 0.0–0.4)
Eos: 1 %
Hematocrit: 49.1 % (ref 37.5–51.0)
Hemoglobin: 16.1 g/dL (ref 13.0–17.7)
Immature Grans (Abs): 0 x10E3/uL (ref 0.0–0.1)
Immature Granulocytes: 0 %
Lymphocytes Absolute: 1.6 x10E3/uL (ref 0.7–3.1)
Lymphs: 16 %
MCH: 30.3 pg (ref 26.6–33.0)
MCHC: 32.8 g/dL (ref 31.5–35.7)
MCV: 93 fL (ref 79–97)
Monocytes Absolute: 0.7 x10E3/uL (ref 0.1–0.9)
Monocytes: 7 %
Neutrophils Absolute: 7.4 x10E3/uL — ABNORMAL HIGH (ref 1.4–7.0)
Neutrophils: 76 %
Platelets: 229 x10E3/uL (ref 150–450)
RBC: 5.31 x10E6/uL (ref 4.14–5.80)
RDW: 12.1 % (ref 11.6–15.4)
WBC: 9.8 x10E3/uL (ref 3.4–10.8)

## 2024-09-02 LAB — CMP14+EGFR
ALT: 11 IU/L (ref 0–44)
AST: 15 IU/L (ref 0–40)
Albumin: 4 g/dL (ref 3.8–4.8)
Alkaline Phosphatase: 80 IU/L (ref 47–123)
BUN/Creatinine Ratio: 9 — AB (ref 10–24)
BUN: 11 mg/dL (ref 8–27)
Bilirubin Total: 0.5 mg/dL (ref 0.0–1.2)
CO2: 24 mmol/L (ref 20–29)
Calcium: 9.3 mg/dL (ref 8.6–10.2)
Chloride: 98 mmol/L (ref 96–106)
Creatinine, Ser: 1.21 mg/dL (ref 0.76–1.27)
Globulin, Total: 3.2 g/dL (ref 1.5–4.5)
Glucose: 116 mg/dL — AB (ref 70–99)
Potassium: 4.5 mmol/L (ref 3.5–5.2)
Sodium: 136 mmol/L (ref 134–144)
Total Protein: 7.2 g/dL (ref 6.0–8.5)
eGFR: 61 mL/min/1.73 (ref >59)

## 2024-09-06 ENCOUNTER — Other Ambulatory Visit: Payer: Self-pay

## 2024-09-06 ENCOUNTER — Ambulatory Visit: Payer: Self-pay | Admitting: Family Medicine

## 2024-09-09 ENCOUNTER — Other Ambulatory Visit: Payer: Self-pay

## 2024-09-13 ENCOUNTER — Other Ambulatory Visit: Payer: Self-pay | Admitting: Family Medicine

## 2024-09-13 ENCOUNTER — Other Ambulatory Visit: Payer: Self-pay

## 2024-09-13 DIAGNOSIS — E785 Hyperlipidemia, unspecified: Secondary | ICD-10-CM

## 2024-09-14 ENCOUNTER — Other Ambulatory Visit: Payer: Self-pay

## 2024-09-14 NOTE — Progress Notes (Signed)
 Specialty Pharmacy Ongoing Clinical Assessment Note  Christopher Mata is a 78 y.o. male who is being followed by the specialty pharmacy service for RxSp Interstitial Lung Disease   Patient's specialty medication(s) reviewed today: Pirfenidone    Missed doses in the last 4 weeks: 0   Patient/Caregiver did not have any additional questions or concerns.   Therapeutic benefit summary: Patient is achieving benefit   Adverse events/side effects summary: No adverse events/side effects   Patient's therapy is appropriate to: Continue    Goals Addressed             This Visit's Progress    Maintain optimal adherence to therapy   On track    Patient is on track. Patient will maintain adherence         Follow up: 12 months  [facility]

## 2024-09-14 NOTE — Progress Notes (Signed)
 Specialty Pharmacy Refill Coordination Note  Christopher Mata is a 78 y.o. male contacted today regarding refills of specialty medication(s) Pirfenidone    Patient requested Delivery   Delivery date: 09/16/24   Verified address: [address]   [place] [phone]   Medication will be filled on: 09/15/24

## 2024-09-15 ENCOUNTER — Other Ambulatory Visit: Payer: Self-pay

## 2024-10-08 ENCOUNTER — Other Ambulatory Visit: Payer: Self-pay

## 2024-10-11 ENCOUNTER — Other Ambulatory Visit (HOSPITAL_COMMUNITY): Payer: Self-pay

## 2024-10-11 NOTE — Progress Notes (Signed)
 Specialty Pharmacy Refill Coordination Note  Christopher Mata is a 79 y.o. male contacted today regarding refills of specialty medication(s) Pirfenidone    Patient requested Delivery   Delivery date: 10/14/24   Verified address: [address]   [place] [id number]   Medication will be filled on: 10/13/24   Okay with $402 copay

## 2024-10-13 ENCOUNTER — Other Ambulatory Visit: Payer: Self-pay

## 2024-10-18 ENCOUNTER — Ambulatory Visit (INDEPENDENT_AMBULATORY_CARE_PROVIDER_SITE_OTHER): Admitting: Pulmonary Disease

## 2024-10-18 ENCOUNTER — Other Ambulatory Visit: Payer: Self-pay

## 2024-10-18 ENCOUNTER — Encounter (HOSPITAL_BASED_OUTPATIENT_CLINIC_OR_DEPARTMENT_OTHER): Payer: Self-pay | Admitting: Pulmonary Disease

## 2024-10-18 VITALS — BP 134/97 | HR 104 | Temp 97.7°F | Ht 70.0 in | Wt 184.0 lb

## 2024-10-18 DIAGNOSIS — J9611 Chronic respiratory failure with hypoxia: Secondary | ICD-10-CM | POA: Diagnosis not present

## 2024-10-18 DIAGNOSIS — J84112 Idiopathic pulmonary fibrosis: Secondary | ICD-10-CM | POA: Diagnosis not present

## 2024-10-18 DIAGNOSIS — Z87891 Personal history of nicotine dependence: Secondary | ICD-10-CM

## 2024-10-18 DIAGNOSIS — J849 Interstitial pulmonary disease, unspecified: Secondary | ICD-10-CM

## 2024-10-18 MED ORDER — PIRFENIDONE 801 MG PO TABS
801.0000 mg | ORAL_TABLET | Freq: Three times a day (TID) | ORAL | 4 refills | Status: DC
  Filled 2024-10-18: qty 90, 30d supply, fill #0

## 2024-10-18 NOTE — Progress Notes (Signed)
 Received secure chat from [facility] Pouch - Per Dr. Jude, would like to consolidate pirfenidone  to 801mg  tablet. Rx updated.

## 2024-10-18 NOTE — Progress Notes (Signed)
 "  Subjective:    Patient ID: Christopher Mata, male    DOB: 12-03-1945, 79 y.o.   MRN: [id number]    79 yo remote smoker for FU of IPF He reports dyspnea on exertion worse x 2 years but dating back > 10 years.  Started on ofev 06/2020 PMH - hiatal hernia surgery in 2016 which was complicated by a 7-day hospital stay requiring left-sided thoracentesis, CT chest from then showed minimal basal scarring.     Admitted 04/2020 with Syncope and persistent hypotension ? alpha gal reaction--patient denies eating red meat,  he did have tuna, AKI - resolved   12/2021 stopped ofev due to side effects,   10/2022 started perfenidone    Discussed the use of AI scribe software for clinical note transcription with the patient, who gave verbal consent to proceed.  History of Present Illness Christopher Mata is a 79 year old male with idiopathic pulmonary fibrosis who presents for follow-up of his condition.  He uses supplemental oxygen  primarily at home, especially during sleep, and avoids it when possible because he dislikes it. His symptoms have been stable over the past year on pirfenidone  and prednisone . He has no diarrhea, bloating, or nausea, but has constipation.  He takes pirfenidone  three times daily. It suppresses his appetite so he eats very little, but he has no other significant gastrointestinal side effects. He had severe diarrhea with prior Ofev and wants to avoid similar medications.  He had a breathing test about a month ago. He tires more quickly with activities such as vacuuming and needs to rest, but recovers quickly. He has shortness of breath when getting up at night to use the bathroom.  Blood work was done in November for monitoring while on his medications.      LFTs 08/2024 nml  Significant tests/ events reviewed   Ambulatory saturation 12/2021 >>  desaturates to 88% and recovers with oxygen    HRCT 12/2021  UIP pattern unchanged from 02/2021 Centrilobular and paraseptal  emphysema     HRCT 02/2021 stable UIP pattern compared to 2021   Serology 06/2020 > neg CT chest WO contrast 04/17/20 >> basilar predominant fibrotic interstitial lung disease with mild honeycombing, progressed since 04/04/2015 chest CT. Findings are consistent with UIP    CT angiogram 03/2015 minimal basal scarring   PFTs 10/2021 ratio 78, FEV1 67%, FVC 62%/2.65, improved to 2.86 , TLC 64%, DLCO 9.42/37%, dropped from 13.3   PFTs 05/2020 moderate to severe restriction, ratio 76, FEV1 57%, FVC 55%, TLC 62%, DLCO 52%, no bronchodilator response  Review of Systems  neg for any significant sore throat, dysphagia, itching, sneezing, nasal congestion or excess/ purulent secretions, fever, chills, sweats, unintended wt loss, pleuritic or exertional cp, hempoptysis, orthopnea pnd or change in chronic leg swelling. Also denies presyncope, palpitations, heartburn, abdominal pain, nausea, vomiting, diarrhea or change in bowel or urinary habits, dysuria,hematuria, rash, arthralgias, visual complaints, headache, numbness weakness or ataxia.      Objective:   Physical Exam  Gen. Pleasant, well-nourished, in no distress ENT - no thrush, no pallor/icterus,no post nasal drip Neck: No JVD, no thyromegaly, no carotid bruits Lungs: no use of accessory muscles, no dullness to percussion, BB dry rales no rhonchi  Cardiovascular: Rhythm regular, heart sounds  normal, no murmurs or gallops, no peripheral edema Musculoskeletal: No deformities, no cyanosis or clubbing     O2 sat drops o 80% on RA with walking & recovered with 2 L POC & sustained with walking  Assessment & Plan:   Assessment and Plan Assessment & Plan Idiopathic pulmonary fibrosis Well-managed with pirfenidone . No significant decline in lung function; lung capacity remains at 60% with a slight decrease of 2-3%, likely due to variation. Lung lining at 45%. Reports decreased appetite and mild constipation, but no bloating or nausea.  Pirfenidone  is well-tolerated. Declined [place] due to similar side effects experienced with Ofev. - Continue pirfenidone  therapy. - Switch to higher strength pirfenidone  formulation (one pill instead of three) after current supply is finished. - Perform blood work every three months to monitor liver function. - Avoid public places for a month due to high flu activity.  Chronic respiratory failure with hypoxemia Managed with home oxygen  therapy. Oxygen  is used primarily during sleep and when necessary. Reports increased fatigue with activity, consistent with condition. No peripheral edema. Breathing test shows stable lung function. - Continue home oxygen  therapy as needed, especially during sleep. - Walked around the clinic to assess oxygen  saturation and mobility.   HTN - recheck & FU with PCP   "

## 2024-10-18 NOTE — Patient Instructions (Signed)
" °  VISIT SUMMARY: Today, we discussed the management of your idiopathic pulmonary fibrosis and chronic respiratory failure with hypoxemia. Your condition remains stable with your current medications, and we have made some adjustments to your treatment plan to improve your comfort and convenience.  YOUR PLAN: -IDIOPATHIC PULMONARY FIBROSIS: Idiopathic pulmonary fibrosis is a lung disease that causes scarring of the lungs, making it difficult to breathe. Your condition is well-managed with pirfenidone , and there has been no significant decline in your lung function. We will continue your current therapy and switch to a higher strength formulation of pirfenidone  (one pill instead of three) after you finish your current supply. We will also perform blood work every three months to monitor your liver function. Please avoid public places for a month due to high flu activity.  -CHRONIC RESPIRATORY FAILURE WITH HYPOXEMIA: Chronic respiratory failure with hypoxemia is a condition where your lungs cannot provide enough oxygen  to your body. You are managing this with home oxygen  therapy, primarily using it during sleep and when necessary. Your lung function remains stable, and we recommend continuing your home oxygen  therapy as needed, especially during sleep. We assessed your oxygen  saturation and mobility by having you walk around the clinic.  INSTRUCTIONS: Please continue your current medications and follow the new plan for pirfenidone . Perform blood work every three months to monitor your liver function -next in Feb 2026. Avoid public places for a month due to high flu activity. Continue using home oxygen  therapy as needed, especially during sleep & walking.                      Contains text generated by [employer].                                 Contains text generated by [employer].   "

## 2024-11-04 ENCOUNTER — Ambulatory Visit: Attending: Pulmonary Disease

## 2024-11-04 ENCOUNTER — Other Ambulatory Visit: Payer: Self-pay

## 2024-11-04 DIAGNOSIS — Z79899 Other long term (current) drug therapy: Secondary | ICD-10-CM

## 2024-11-04 DIAGNOSIS — J84112 Idiopathic pulmonary fibrosis: Secondary | ICD-10-CM

## 2024-11-04 MED ORDER — PIRFENIDONE 801 MG PO TABS
801.0000 mg | ORAL_TABLET | Freq: Three times a day (TID) | ORAL | 4 refills | Status: AC
  Filled 2024-11-04 – 2024-11-11 (×2): qty 90, 30d supply, fill #0

## 2024-11-04 NOTE — Addendum Note (Signed)
 Addended by: SHAREN DELON HERO on: 11/04/2024 01:56 PM   Modules accepted: Orders

## 2024-11-04 NOTE — Progress Notes (Signed)
 [facility]  Referring Provider: [facility]  Virtual Visit via Telephone Note  I connected with Christopher Mata on 11/04/24 at  2:00 PM EST by telephone and verified that I am speaking with the correct person using two identifiers.  Location: Patient: home Provider: office   I discussed the limitations, risks, security and privacy concerns of performing an evaluation and management service by telephone and the availability of in person appointments. I also discussed with the patient that there may be a patient responsible charge related to this service. The patient expressed understanding and agreed to proceed.   HPI: Christopher Mata is a 79 y.o. male who presents to the pharmacotherapy clinic via telephone for continuation of therapy with Pirfenidone  for Interstitial Lung Disease.  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   OMEPRAZOLE  (PRILOSEC) 20 MG CAPSULE    Take 1 capsule (20 mg total) by mouth daily.  Modified Medications   Modified Medication Previous Medication   PIRFENIDONE  801 MG TABS Pirfenidone  801 MG TABS      Take 1 tablet  (801 mg total) by mouth in the morning, at noon, and at bedtime.    Take 1 tablet (801 mg total) by mouth in the morning, at noon, and at bedtime.  Discontinued Medications   No medications on file    Allergies: Allergies[1]  Past Medical History: Past Medical History:  Diagnosis Date   Allergy to alpha-gal    GERD (gastroesophageal reflux disease)    History of hiatal hernia    History of nocturia    Hyperlipidemia    Hypertension    Obesity (BMI 30-39.9)    Paraesophageal hiatal hernia s/p robitc repair/fundoplication 03/18/2015 03/18/2015   Seasonal allergies    Shortness of breath dyspnea    with exertion   Vertigo     Social History: Social History   Socioeconomic History   Marital status: Married    Spouse name: Not on file   Number of children: Not on file   Years of education: Not on file   Highest education level: Not on file  Occupational History   Not on file  Tobacco Use   Smoking status: Former    Current packs/day: 0.00    Average packs/day: 1 pack/day for 20.0 years (20.0 ttl pk-yrs)    Types: Cigarettes    Start date: 12/06/1966    Quit date: 12/06/1986    Years since quitting: 37.9   Smokeless tobacco: Current    Types: Snuff  Vaping Use   Vaping status: Never Used  Substance and Sexual Activity   Alcohol use: No    Comment: quit 1.5 years ago.'  Drug use: No   Sexual activity: Not Currently  Other Topics Concern   Not on file  Social History Narrative   Not on file   Social Drivers of Health   Tobacco Use: High Risk (10/18/2024)   Patient History    Smoking Tobacco Use: Former    Smokeless Tobacco Use: Current    Passive Exposure: Not on Actuary Strain: Not on file  Food Insecurity: Not on file  Transportation Needs: Not on file  Physical Activity: Not on file  Stress: Not on file  Social Connections: Not on file  Depression (PHQ2-9): Low Risk (09/01/2024)   Depression (PHQ2-9)    PHQ-2 Score: 2  Alcohol Screen:  Not on file  Housing: Not on file  Utilities: Not on file  Health Literacy: Not on file    Medication: Pirfenidone  801mg  three times daily. Counseled patient on consolidation from 267mg  tablets that he was taking previously.   Assessment and Plan  Esbriet  Medication Management Thoroughly counseled patient on the efficacy, mechanism of action, dosing, administration, adverse effects, and monitoring parameters of Esbriet .  Patient verbalized understanding.   Goals of Therapy: Will not stop or reverse the progression of ILD. It will slow the progression of ILD.   Dosing: Starting dose will be Esbriet  267 mg 1 tablet three times daily for 7 days, then 2 tablets three times daily for 7 days, then 3 tablets three times daily.  Maintenance dose will be 801 mg 1 tablet three times daily if tolerated.  Stressed the importance of taking with meals and space at least 5-6 hours apart to minimize stomach upset.   Adverse Effects: Nausea, vomiting, diarrhea, weight loss Abdominal pain GERD Sun sensitivity/rash - patient advised to wear sunscreen when exposed to sunlight Dizziness Fatigue  Monitoring: Monitor for diarrhea, nausea and vomiting, GI perforation, hepatotoxicity  Monitor LFTs - baseline, monthly for first 6 months, then every 3 months routinely Pregnancy test - baseline prn CBC w differential at baseline and every 3 months routinely  Medication Reconciliation A drug regimen assessment was performed, including review of allergies, interactions, disease-state management, dosing and immunization history. Medications were reviewed with the patient, including name, instructions, indication, goals of therapy, potential side effects, importance of adherence, and safe use.   I discussed the assessment and treatment plan with the patient. The patient was provided an opportunity to ask questions and all were answered. The patient agreed with the plan and demonstrated an understanding of the  instructions.   The patient was advised to call back or seek an in-person evaluation if the symptoms worsen or if the condition fails to improve as anticipated.  I provided 10 minutes of non-face-to-face time during this encounter.  Delon Brow, PharmD, CSP, AAHIVP, CPP Clinical Pharmacist Practitioner - Medication Therapy Disease Management/Specialty Pharmacy Services 11/04/2024, 2:09 PM     [1] No Known Allergies

## 2024-11-11 ENCOUNTER — Other Ambulatory Visit: Payer: Self-pay

## 2024-11-11 ENCOUNTER — Other Ambulatory Visit (HOSPITAL_COMMUNITY): Payer: Self-pay
